# Patient Record
Sex: Male | Born: 1990 | Race: Black or African American | Hispanic: No | Marital: Single | Smoking: Never smoker
Health system: Southern US, Community
[De-identification: ages and names within clinical notes are randomized; demographics above are authoritative.]

---
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
## 2010-10-13 LAB — RAPID STREP SCREEN (MED CTR MEBANE ONLY): Streptococcus, Group A Screen (Direct): NEGATIVE

## 2011-04-27 ENCOUNTER — Ambulatory Visit: Payer: 59

## 2011-04-27 ENCOUNTER — Ambulatory Visit (INDEPENDENT_AMBULATORY_CARE_PROVIDER_SITE_OTHER): Payer: 59 | Admitting: Internal Medicine

## 2011-04-27 VITALS — BP 125/77 | HR 56 | Temp 98.4°F | Resp 16 | Ht 72.0 in | Wt 178.8 lb

## 2011-04-27 DIAGNOSIS — M79609 Pain in unspecified limb: Secondary | ICD-10-CM

## 2011-04-27 DIAGNOSIS — M79604 Pain in right leg: Secondary | ICD-10-CM

## 2011-04-27 DIAGNOSIS — M79669 Pain in unspecified lower leg: Secondary | ICD-10-CM

## 2011-04-27 DIAGNOSIS — IMO0002 Reserved for concepts with insufficient information to code with codable children: Secondary | ICD-10-CM

## 2011-04-27 MED ORDER — CYCLOBENZAPRINE HCL 5 MG PO TABS: 5.0000 mg | ORAL_TABLET | Freq: Three times a day (TID) | ORAL | Status: AC | PRN

## 2011-04-27 MED ORDER — MELOXICAM 7.5 MG PO TABS: 7.5000 mg | ORAL_TABLET | Freq: Every day | ORAL | Status: AC

## 2011-04-27 NOTE — Progress Notes (Signed)
Patient ID: Mason Quinn MRN: [id number], DOB: 03-Sep-1990, 20 y.o. Date of Encounter: 04/27/2011, 1:36 PM  Primary Physician: No primary provider on file.  Chief Complaint: Right leg pain  HPI: 21 y.o. year old male with history below presents with right leg pain for 3 weeks. Pain is located along proximal right knee up to the right inguinal fold. No known injury. Patient states his pain is located only along the anterior aspect of the right leg from the knee to the inguinal. He states he was playing football when he first noticed the pain, as his activity continued his pain gradually continued to worsen. He now states that he cannot take off and run full speed in practice without pain. He is able to walk with minimal difficulty. He states his leg feels tight. He will have sharp pain to palpation. 1 week into his pain he did rest the leg and received considerable improvement, however when he returned to full duty at practice after that his pain returned. He has tried ice, Motrin, and [employer] with some relief. He denies any trauma to the leg. Never with any erythema, ecchymosis, or STS. No pain along the joints of the knee or hip. No back pain. No urinary symptoms. No rash, wounds, or lesions.   No past medical history on file.   Home Meds: Prior to Admission medications   Not on File    Allergies: Not on File  History   Social History  . Marital Status: Single    Spouse Name: N/A    Number of Children: N/A  . Years of Education: N/A   Occupational History  . Not on file.   Social History Main Topics  . Smoking status: Never Smoker   . Smokeless tobacco: Not on file  . Alcohol Use: Not on file  . Drug Use: Not on file  . Sexually Active: Not on file   Other Topics Concern  . Not on file   Social History Narrative  . No narrative on file     Review of Systems: Constitutional: negative for chills, fever, night sweats, weight changes, or fatigue  HEENT: negative for  vision changes, hearing loss, congestion, rhinorrhea, ST, epistaxis, or sinus pressure Cardiovascular: negative for chest pain or palpitations Respiratory: negative for hemoptysis, wheezing, shortness of breath, or cough Abdominal: negative for abdominal pain, nausea, vomiting, diarrhea, or constipation Dermatological: negative for rash Neurologic: negative for headache, dizziness, or syncope All other systems reviewed and are otherwise negative with the exception to those above and in the HPI.   Physical Exam: Blood pressure 125/77, pulse 56, temperature 98.4 F (36.9 C), temperature source Oral, resp. rate 16, height 6' (1.829 m), weight 178 lb 12.8 oz (81.103 kg)., Body mass index is 24.25 kg/(m^2). General: Well developed, well nourished, in no acute distress. Head: Normocephalic, atraumatic, eyes without discharge, sclera non-icteric, nares are without discharge.   Neck: Supple. No thyromegaly. Full ROM. No lymphadenopathy. Lungs: Clear bilaterally to auscultation without wheezes, rales, or rhonchi. Breathing is unlabored. Heart: RRR with S1 S2. No murmurs, rubs, or gallops appreciated. Msk:  Strength and tone normal for age. Extremities/Skin: Right leg with TTP along the  just proximal to the knee along the anterior aspect of the leg only up to the inguinal fold. No TTP medially, laterally, or posteriorly. No boney TTP. FROM through out all joints without pain, 5/5 strength through out all joints. He does exhibit some discomfort along the anterior portion of the leg with resisted  flexion of the knee, but no knee pain. Flexion and extension intact with passive, active, and resisted ROM. Distal pulses 2+. No erythema, STS, or ecchymosis.  Neuro: Alert and oriented X 3. Moves all extremities spontaneously. Gait is normal. CNII-XII grossly in tact. Psych:  Responds to questions appropriately with a normal affect.   [facility] reading (PRIMARY) by  Dr. Merla Riches. Right Hip: Old avulsion fracture,  o/w neg Right Femur: neg Right Knee: neg   ASSESSMENT AND PLAN:  21 y.o. year old male with leg strain -PT -Mobic 7.5 mg 1 po daily prn RF 3 -Flexeril 5 mg #30 1 po tid prn RF 3 -RICE -No football activity until cleared by PT  Signed, Eula Listen, PA-C 04/27/2011 1:36 PM

## 2011-08-04 ENCOUNTER — Telehealth: Payer: Self-pay

## 2011-08-04 NOTE — Telephone Encounter (Signed)
The patient stated he is a patient of Dr. Merla Riches and would like either Dr. Merla Riches or a male provider to return his call regarding some questions that he has.  Please call the patient at [phone].

## 2011-08-04 NOTE — Telephone Encounter (Signed)
Please try and get some information from this patient.

## 2011-08-05 NOTE — Telephone Encounter (Signed)
LMOM to give [place] call back.

## 2011-08-05 NOTE — Telephone Encounter (Signed)
Pt wanted to discuss with Merla Riches about PE, but he will be coming in tomorrow to [place] at [facility]. So he will discuss further then.

## 2011-08-06 ENCOUNTER — Ambulatory Visit (INDEPENDENT_AMBULATORY_CARE_PROVIDER_SITE_OTHER): Payer: 59 | Admitting: Emergency Medicine

## 2011-08-06 VITALS — BP 106/56 | HR 60 | Temp 98.2°F | Resp 16 | Ht 70.0 in | Wt 181.6 lb

## 2011-08-06 DIAGNOSIS — Z0000 Encounter for general adult medical examination without abnormal findings: Secondary | ICD-10-CM

## 2011-08-06 DIAGNOSIS — D239 Other benign neoplasm of skin, unspecified: Secondary | ICD-10-CM

## 2011-08-06 NOTE — Progress Notes (Signed)
  Date:  08/06/2011   Name:  Mason Quinn   DOB:  26-Jul-1990   MRN:  [id number]  PCP:  No primary provider on file.    Chief Complaint: Annual Exam   History of Present Illness:  Mason Quinn is a 21 y.o. very pleasant male patient who presents with the following:  No current complaints.  Is employed as a Retail buyer for [employer].  No meds.  Non smoker.  No surgical history  There is no problem list on file for this patient.   No past medical history on file.  No past surgical history on file.  History  Substance Use Topics  . Smoking status: Never Smoker   . Smokeless tobacco: Not on file  . Alcohol Use: Not on file    No family history on file.  No Known Allergies  Medication list has been reviewed and updated.  Current Outpatient Prescriptions on File Prior to Visit  Medication Sig Dispense Refill  . meloxicam (MOBIC) 7.5 MG tablet Take 1 tablet (7.5 mg total) by mouth daily.  30 tablet  3    Review of Systems:  As per HPI, otherwise negative.    Physical Examination: Filed Vitals:   08/06/11 1259  BP: 106/56  Pulse: 60  Temp: 98.2 F (36.8 C)  Resp: 16   Filed Vitals:   08/06/11 1259  Height: 5\' 10"  (1.778 m)  Weight: 181 lb 9.6 oz (82.373 kg)   Body mass index is 26.06 kg/(m^2). Ideal Body Weight: Weight in (lb) to have BMI = 25: 173.9   GEN: WDWN, NAD, Non-toxic, A & O x 3 HEENT: Atraumatic, Normocephalic. Neck supple. No masses, No LAD. Ears and Nose: No external deformity. CV: RRR, No M/G/R. No JVD. No thrill. No extra heart sounds. PULM: CTA B, no wheezes, crackles, rhonchi. No retractions. No resp. distress. No accessory muscle use. ABD: S, NT, ND, +BS. No rebound. No HSM. EXTR: No c/c/e NEURO Normal gait.  PSYCH: Normally interactive. Conversant. Not depressed or anxious appearing.  Calm demeanor.   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 Path submitted. Carmelina Dane, MD

## 2011-08-06 NOTE — Addendum Note (Signed)
Addended by: Carmelina Dane on: 08/06/2011 02:46 PM   Modules accepted: Orders

## 2011-08-12 ENCOUNTER — Encounter: Payer: Self-pay | Admitting: Emergency Medicine

## 2011-12-13 ENCOUNTER — Ambulatory Visit (INDEPENDENT_AMBULATORY_CARE_PROVIDER_SITE_OTHER): Payer: 59 | Admitting: Emergency Medicine

## 2011-12-13 ENCOUNTER — Other Ambulatory Visit: Payer: Self-pay | Admitting: Emergency Medicine

## 2011-12-13 ENCOUNTER — Ambulatory Visit: Payer: 59

## 2011-12-13 VITALS — BP 146/84 | HR 80 | Temp 97.8°F | Resp 18 | Ht 70.5 in | Wt 183.0 lb

## 2011-12-13 DIAGNOSIS — S86919A Strain of unspecified muscle(s) and tendon(s) at lower leg level, unspecified leg, initial encounter: Secondary | ICD-10-CM

## 2011-12-13 DIAGNOSIS — S86819A Strain of other muscle(s) and tendon(s) at lower leg level, unspecified leg, initial encounter: Secondary | ICD-10-CM

## 2011-12-13 DIAGNOSIS — A630 Anogenital (venereal) warts: Secondary | ICD-10-CM

## 2011-12-13 DIAGNOSIS — S838X9A Sprain of other specified parts of unspecified knee, initial encounter: Secondary | ICD-10-CM

## 2011-12-13 DIAGNOSIS — J329 Chronic sinusitis, unspecified: Secondary | ICD-10-CM

## 2011-12-13 DIAGNOSIS — J40 Bronchitis, not specified as acute or chronic: Secondary | ICD-10-CM

## 2011-12-13 LAB — POCT INFLUENZA A/B: Influenza B, POC: NEGATIVE

## 2011-12-13 MED ORDER — PODOFILOX 0.5 % EX GEL: CUTANEOUS | Status: DC

## 2011-12-13 MED ORDER — AMOXICILLIN 875 MG PO TABS: 875.0000 mg | ORAL_TABLET | Freq: Two times a day (BID) | ORAL | Status: DC

## 2011-12-13 NOTE — Progress Notes (Signed)
  Subjective:    Patient ID: Mason Quinn, male    DOB: 10-Dec-1990, 21 y.o.   MRN: [id number]  HPI patient states that about a week ago he had an episode of head congestion sore throat and cough. This seemed to resolve and then over the weekend he developed symptoms again with head congestion sore throat and a productive cough. He also has had some colored drainage from his nose.    Review of Systems patient plays flag football and had a twisting injury to his right knee approximately 3 weeks ago he felt a pop in his knee at that time and has had pain in his knee off and on ever since. He also would like treatment for multiple warts he has in his genital area. He saw Dr. Dareen Piano in the summer and had biopsy of 2 lesions and they were found to be warts.     Objective:   Physical Exam HEENT exam is unremarkable. His neck is supple. Chest is clear to auscultation and percussion cardiac exam is regular rate without murmurs or gallops. Examination of the right knee reveals tenderness over the medial joint space on the right. There is a positive McMurray's test. Negative drawer sign. He has no effusion noted. Results for orders placed in visit on 12/13/11  POCT INFLUENZA A/B      Component Value Range   Influenza A, POC Negative     Influenza B, POC Negative    POCT RAPID STREP A (OFFICE)      Component Value Range   Rapid Strep A Screen Negative  Negative   [facility] reading (PRIMARY) by  Dr.Andrae Claunch normal right knee. 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     Assessment & Plan:  Patient here with a sinusitis we will treat with amoxicillin twice a day. He was advised he has a medial meniscus injury of his knee and if he continues to have problems he will need to see the orthopedist or have an MRI. He has multiple condylomata across the pubic area and we'll treat this with Condylox .

## 2012-01-10 ENCOUNTER — Ambulatory Visit (INDEPENDENT_AMBULATORY_CARE_PROVIDER_SITE_OTHER): Payer: 59 | Admitting: Emergency Medicine

## 2012-01-10 VITALS — BP 123/74 | HR 48 | Temp 98.6°F | Resp 16 | Ht 70.0 in | Wt 182.0 lb

## 2012-01-10 DIAGNOSIS — M2390 Unspecified internal derangement of unspecified knee: Secondary | ICD-10-CM

## 2012-01-10 MED ORDER — MELOXICAM 15 MG PO TABS: 15.0000 mg | ORAL_TABLET | Freq: Every day | ORAL | Status: DC

## 2012-01-10 NOTE — Progress Notes (Signed)
Reviewed and agree.

## 2012-01-10 NOTE — Patient Instructions (Signed)
Knee Sprain  A knee sprain is a tear in one of the strong, fibrous tissues that connect the bones (ligaments) in your knee. The severity of the sprain depends on how much of the ligament is torn. The tear can be either partial or complete.  CAUSES   Often, sprains are a result of a fall or injury. The force of the impact causes the fibers of your ligament to stretch too much. This excess tension causes the fibers of your ligament to tear.  SYMPTOMS   You may have some loss of motion in your knee. Other symptoms include:   Bruising.   Tenderness.   Swelling.  DIAGNOSIS   In order to diagnose knee sprain, your caregiver will physically examine your knee to determine how torn the ligament is. Your caregiver may also suggest an X-ray exam of your knee to make sure no bones are broken.  TREATMENT   If your ligament is only partially torn, treatment usually involves keeping the knee in a fixed position (immobilization) or bracing your knee for activities that require movement for several weeks. To do this, your caregiver will apply a bandage, cast, or splint to keep your knee from moving or support your knee during movement until it heals. For a partially torn ligament, the healing process usually takes 4 to 6 weeks.  If your ligament is completely torn, depending on which ligament it is, you may need surgery to reconnect the ligament to the bone or reconstruct it. After surgery, a cast or splint may be applied and will need to stay on your knee for 4 to 6 weeks while your ligament heals.  HOME CARE INSTRUCTIONS   Keep your injured knee elevated to decrease swelling.   To ease pain and swelling, apply ice to your knee twice a day, for 2 to 3 days:   Put ice in a plastic bag.   Place a towel between your skin and the bag.   Leave the ice on for 15 minutes.   Only take over-the-counter or prescription medicine for pain as directed by your caregiver.   Do not leave your knee unprotected until pain and stiffness go  away (usually 4 to 6 weeks).   Do not allow your cast or splint to get wet. If you have been instructed not to remove it, cover your cast or splint with a plastic bag when you shower or bathe. Do not swim.   Your caregiver may suggest exercises for you to do during your recovery to prevent or limit permanent weakness and stiffness.  SEEK IMMEDIATE MEDICAL CARE IF:   Your cast or splint becomes damaged.   Your pain becomes worse.  MAKE SURE YOU:   Understand these instructions.   Will watch your condition.   Will get help right away if you are not doing well or get worse.  Document Released: 12/28/2004 Document Revised: 03/22/2011 Document Reviewed: 12/12/2010  ExitCare Patient Information 2013 [employer].

## 2012-01-10 NOTE — Progress Notes (Signed)
[facility] [address], [place] [address] [phone]  Date:  01/10/2012   Name:  Mason Quinn   DOB:  06/01/1990   MRN:  [id number]  PCP:  No primary provider on file.    Chief Complaint: Knee Pain   History of Present Illness:  Mason Quinn is a 21 y.o. very pleasant male patient who presents with the following:  Injured knee five weeks ago when he planted the right knee and was hit playing football.  Had xray 3 weeks ago and was negative.  Still has pain when walks at work.  Sometimes on arising in the morning.  Denies clicking or locking.  No effusion.  There is no problem list on file for this patient.   History reviewed. No pertinent past medical history.  History reviewed. No pertinent past surgical history.  History  Substance Use Topics  . Smoking status: Never Smoker   . Smokeless tobacco: Not on file  . Alcohol Use: No    History reviewed. No pertinent family history.  No Known Allergies  Medication list has been reviewed and updated.  Current Outpatient Prescriptions on File Prior to Visit  Medication Sig Dispense Refill  . amoxicillin (AMOXIL) 875 MG tablet Take 1 tablet (875 mg total) by mouth 2 (two) times daily.  20 tablet  0  . meloxicam (MOBIC) 7.5 MG tablet Take 1 tablet (7.5 mg total) by mouth daily.  30 tablet  3  . podofilox (CONDYLOX) 0.5 % gel Apply twice daily for 3 days then rest 4 days and repeat this cycle 4 times  3.5 g  3    Review of Systems:  As per HPI, otherwise negative.    Physical Examination: Filed Vitals:   01/10/12 1048  BP: 123/74  Pulse: 48  Temp: 98.6 F (37 C)  Resp: 16   Filed Vitals:   01/10/12 1048  Height: 5\' 10"  (1.778 m)  Weight: 182 lb (82.555 kg)   Body mass index is 26.11 kg/(m^2). Ideal Body Weight: Weight in (lb) to have BMI = 25: 173.9    GEN: WDWN, NAD, Non-toxic, Alert & Oriented x 3 HEENT: Atraumatic, Normocephalic.  Ears and Nose: No external  deformity. EXTR: No clubbing/cyanosis/edema.  Right knee tenderness medial knee.  Joint stable, full passive and active range of motion. NEURO: Normal gait.  PSYCH: Normally interactive. Conversant. Not depressed or anxious appearing.  Calm demeanor.    Assessment and Plan: Internal derangement of knee mobic Follow up with Dr Neva Seat for sports medicine  Dareen Piano, Tessa Lerner, MD

## 2013-03-19 ENCOUNTER — Ambulatory Visit (INDEPENDENT_AMBULATORY_CARE_PROVIDER_SITE_OTHER): Payer: 59 | Admitting: Emergency Medicine

## 2013-03-19 ENCOUNTER — Ambulatory Visit: Payer: 59

## 2013-03-19 VITALS — BP 118/78 | HR 63 | Temp 99.1°F | Resp 18 | Ht 71.0 in | Wt 180.6 lb

## 2013-03-19 DIAGNOSIS — S335XXA Sprain of ligaments of lumbar spine, initial encounter: Secondary | ICD-10-CM

## 2013-03-19 DIAGNOSIS — Z0000 Encounter for general adult medical examination without abnormal findings: Secondary | ICD-10-CM

## 2013-03-19 DIAGNOSIS — M79609 Pain in unspecified limb: Secondary | ICD-10-CM

## 2013-03-19 DIAGNOSIS — M549 Dorsalgia, unspecified: Secondary | ICD-10-CM

## 2013-03-19 DIAGNOSIS — M79673 Pain in unspecified foot: Secondary | ICD-10-CM

## 2013-03-19 MED ORDER — POLYETHYLENE GLYCOL 3350 17 GM/SCOOP PO POWD: 17.0000 g | Freq: Once | ORAL | Status: DC

## 2013-03-19 MED ORDER — NAPROXEN SODIUM 550 MG PO TABS: 550.0000 mg | ORAL_TABLET | Freq: Two times a day (BID) | ORAL | Status: DC

## 2013-03-19 NOTE — Patient Instructions (Signed)
Lumbosacral Strain Lumbosacral strain is a strain of any of the parts that make up your lumbosacral vertebrae. Your lumbosacral vertebrae are the bones that make up the lower third of your backbone. Your lumbosacral vertebrae are held together by muscles and tough, fibrous tissue (ligaments).  CAUSES  A sudden blow to your back can cause lumbosacral strain. Also, anything that causes an excessive stretch of the muscles in the low back can cause this strain. This is typically seen when people exert themselves strenuously, fall, lift heavy objects, bend, or crouch repeatedly. RISK FACTORS  Physically demanding work.  Participation in pushing or pulling sports or sports that require sudden twist of the back (tennis, golf, baseball).  Weight lifting.  Excessive lower back curvature.  Forward-tilted pelvis.  Weak back or abdominal muscles or both.  Tight hamstrings. SIGNS AND SYMPTOMS  Lumbosacral strain may cause pain in the area of your injury or pain that moves (radiates) down your leg.  DIAGNOSIS Your health care provider can often diagnose lumbosacral strain through a physical exam. In some cases, you may need tests such as X-ray exams.  TREATMENT  Treatment for your lower back injury depends on many factors that your clinician will have to evaluate. However, most treatment will include the use of anti-inflammatory medicines. HOME CARE INSTRUCTIONS   Avoid hard physical activities (tennis, racquetball, waterskiing) if you are not in proper physical condition for it. This may aggravate or create problems.  If you have a back problem, avoid sports requiring sudden body movements. Swimming and walking are generally safer activities.  Maintain good posture.  Maintain a healthy weight.  For acute conditions, you may put ice on the injured area.  Put ice in a plastic bag.  Place a towel between your skin and the bag.  Leave the ice on for 20 minutes, 2 3 times a day.  When the  low back starts healing, stretching and strengthening exercises may be recommended. SEEK MEDICAL CARE IF:  Your back pain is getting worse.  You experience severe back pain not relieved with medicines. SEEK IMMEDIATE MEDICAL CARE IF:   You have numbness, tingling, weakness, or problems with the use of your arms or legs.  There is a change in bowel or bladder control.  You have increasing pain in any area of the body, including your belly (abdomen).  You notice shortness of breath, dizziness, or feel faint.  You feel sick to your stomach (nauseous), are throwing up (vomiting), or become sweaty.  You notice discoloration of your toes or legs, or your feet get very cold. MAKE SURE YOU:   Understand these instructions.  Will watch your condition.  Will get help right away if you are not doing well or get worse. Document Released: 10/07/2004 Document Revised: 10/18/2012 Document Reviewed: 08/16/2012 ExitCare Patient Information 2014 [employer].  

## 2013-03-19 NOTE — Addendum Note (Signed)
Addended by: Ellison Carwin S on: 03/19/2013 08:00 PM   Modules accepted: Orders

## 2013-03-19 NOTE — Progress Notes (Addendum)
[facility] [address] [place] [address] [phone]  Date:  03/19/2013   Name:  Mason Quinn   DOB:  [birth year]   MRN:  [id number]  PCP:  No PCP Per Patient    Chief Complaint: Annual Exam, Back Pain and Foot Injury   History of Present Illness:  Mason Quinn is a 23 y.o. very pleasant male patient who presents with the following:  For wellness examination.  Non smoker.  Just graduated college.  Working full time.  Has pain in low back following a football game.  No direct history of injury.  Has no radiation of pain.  No neuro symptoms.   Has pain in the lateral plantar foot near base of 5th MT.  No history of injury.  Denies other complaint or health concern today.   There are no active problems to display for this patient.   No past medical history on file.  No past surgical history on file.  History  Substance Use Topics  . Smoking status: Never Smoker   . Smokeless tobacco: Not on file  . Alcohol Use: No    No family history on file.  No Known Allergies  Medication list has been reviewed and updated.  No current outpatient prescriptions on file prior to visit.   No current facility-administered medications on file prior to visit.    Review of Systems:  As per HPI, otherwise negative.    Physical Examination: Filed Vitals:   03/19/13 1842  BP: 118/78  Pulse: 63  Temp: 99.1 F (37.3 C)  Resp: 18   Filed Vitals:   03/19/13 1842  Height: 5\' 11"  (1.803 m)  Weight: 180 lb 9.6 oz (81.92 kg)   Body mass index is 25.2 kg/(m^2). Ideal Body Weight: Weight in (lb) to have BMI = 25: 178.9  GEN: WDWN, NAD, Non-toxic, A & O x 3 HEENT: Atraumatic, Normocephalic. Neck supple. No masses, No LAD. Ears and Nose: No external deformity. CV: RRR, No M/G/R. No JVD. No thrill. No extra heart sounds. PULM: CTA B, no wheezes, crackles, rhonchi. No retractions. No resp. distress. No accessory muscle use. ABD: S, NT, ND, +BS. No  rebound. No HSM. EXTR: No c/c/e NEURO Normal gait.  PSYCH: Normally interactive. Conversant. Not depressed or anxious appearing.  Calm demeanor.    Assessment and Plan: Wellness examination   Signed,  Ellison Carwin, MD   [facility] reading (PRIMARY) by  Dr. Ouida Sills.  Negative foot .  [facility] reading (PRIMARY) by  Dr. Ouida Sills.  Negative LS spine.

## 2013-03-19 NOTE — Addendum Note (Signed)
Addended by: Roselee Culver on: 03/19/2013 07:19 PM   Modules accepted: Orders, Level of Service

## 2013-06-05 ENCOUNTER — Ambulatory Visit: Payer: 59

## 2013-06-05 ENCOUNTER — Ambulatory Visit (INDEPENDENT_AMBULATORY_CARE_PROVIDER_SITE_OTHER): Payer: 59 | Admitting: Emergency Medicine

## 2013-06-05 VITALS — BP 128/78 | HR 54 | Temp 98.8°F | Resp 16 | Ht 69.25 in | Wt 178.0 lb

## 2013-06-05 DIAGNOSIS — S43006A Unspecified dislocation of unspecified shoulder joint, initial encounter: Secondary | ICD-10-CM

## 2013-06-05 DIAGNOSIS — Z299 Encounter for prophylactic measures, unspecified: Secondary | ICD-10-CM

## 2013-06-05 DIAGNOSIS — R300 Dysuria: Secondary | ICD-10-CM

## 2013-06-05 DIAGNOSIS — Z113 Encounter for screening for infections with a predominantly sexual mode of transmission: Secondary | ICD-10-CM

## 2013-06-05 DIAGNOSIS — Z298 Encounter for other specified prophylactic measures: Secondary | ICD-10-CM

## 2013-06-05 LAB — POCT URINALYSIS DIPSTICK
BILIRUBIN UA: NEGATIVE
Glucose, UA: NEGATIVE
Ketones, UA: NEGATIVE
NITRITE UA: NEGATIVE
PH UA: 7
PROTEIN UA: NEGATIVE
RBC UA: NEGATIVE
Spec Grav, UA: 1.015
Urobilinogen, UA: 0.2

## 2013-06-05 LAB — POCT UA - MICROSCOPIC ONLY
Bacteria, U Microscopic: NEGATIVE
CASTS, UR, LPF, POC: NEGATIVE
Crystals, Ur, HPF, POC: NEGATIVE
EPITHELIAL CELLS, URINE PER MICROSCOPY: NEGATIVE
Mucus, UA: NEGATIVE
RBC, urine, microscopic: NEGATIVE
WBC, Ur, HPF, POC: NEGATIVE
Yeast, UA: NEGATIVE

## 2013-06-05 MED ORDER — AZITHROMYCIN 250 MG PO TABS: ORAL_TABLET | ORAL | Status: DC

## 2013-06-05 MED ORDER — CEFTRIAXONE SODIUM 1 G IJ SOLR
250.0000 mg | Freq: Once | INTRAMUSCULAR | Status: AC
  Administered 2013-06-05: 250 mg via INTRAMUSCULAR

## 2013-06-05 NOTE — Progress Notes (Addendum)
   Subjective:    Patient ID: Mason Quinn, male    DOB: 23-Aug-1990, 23 y.o.   MRN: [id number]  HPI 23 yo male with 2 complaints:  1.  History of right shoulder dislocation twice in past who had it come out twice this Sunday.  States he was boxing and it came out once, then fell jetskiing and it came out again.  Self reduced both times.  Limited ROM since then.  Continued pain.  2.  Also complaints of dysuria for last 12 hours.  States he has been using protection with sexually activity (condoms).  States increased frequency with some discharge and pain.  No abdominal pain.  PPMH:  Noncontributory  SH:  Non smoker, no alcohol   Review of Systems  Constitutional: Negative for fever and chills.  Gastrointestinal: Negative for nausea, vomiting, diarrhea and constipation.  Genitourinary: Positive for dysuria, urgency, discharge and difficulty urinating. Negative for frequency, penile swelling, genital sores, penile pain and testicular pain.  Musculoskeletal: Positive for arthralgias. Negative for back pain, joint swelling and neck pain.       Objective:   Physical Exam Blood pressure 128/78, pulse 54, temperature 98.8 F (37.1 C), temperature source Oral, resp. rate 16, height 5' 9.25" (1.759 m), weight 178 lb (80.74 kg), SpO2 100.00%. Body mass index is 26.09 kg/(m^2). Well-developed, well nourished male who is awake, alert and oriented, in NAD. HEENT: [place]/AT, PERRL, EOMI.  Sclera and conjunctiva are clear. OP is clear. Neck: supple, non-tender, no lymphadenopathy, thyromegaly. Heart: RRR, no murmur Lungs: normal effort, CTA Abdomen: normo-active bowel sounds, supple, non-tender, no mass or organomegaly. Extremities: right shoulder with limited ROM in all 4 directions.  Positive apprehension.  N/V intact. Skin: warm and dry without rash. Psychologic: good mood and appropriate affect, normal speech and behavior.  Results for orders placed in visit on 06/05/13  POCT URINALYSIS  DIPSTICK      Result Value Ref Range   Color, UA yellow     Clarity, UA clear     Glucose, UA neg     Bilirubin, UA neg     Ketones, UA neg     Spec Grav, UA 1.015     Blood, UA neg     pH, UA 7.0     Protein, UA neg     Urobilinogen, UA 0.2     Nitrite, UA neg     Leukocytes, UA Trace    POCT UA - MICROSCOPIC ONLY      Result Value Ref Range   WBC, Ur, HPF, POC neg     RBC, urine, microscopic neg     Bacteria, U Microscopic neg     Mucus, UA neg     Epithelial cells, urine per micros neg     Crystals, Ur, HPF, POC neg     Casts, Ur, LPF, POC neg     Yeast, UA neg     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 Dysuria - will treat for GC/Chlamydia HIV test sent per patient request   I have reviewed and agree with documentation. Robert P. Laney Pastor, M.D.

## 2013-06-05 NOTE — Patient Instructions (Addendum)
Shoulder Dislocation Your shoulder is made up of three bones: the collar bone (clavicle); the shoulder blade (scapula), which includes the socket (glenoid cavity); and the upper arm bone (humerus). Your shoulder joint is the place where these bones meet. Strong, fibrous tissues hold these bones together (ligaments). Muscles and strong, fibrous tissues that connect the muscles to these bones (tendons) allow your arm to move through this joint. The range of motion of your shoulder joint is more extensive than most of your other joints, and the glenoid cavity is very shallow. That is the reason that your shoulder joint is one of the most unstable joints in your body. It is far more prone to dislocation than your other joints. Shoulder dislocation is when your humerus is forced out of your shoulder joint. CAUSES Shoulder dislocation is caused by a forceful impact on your shoulder. This impact usually is from an injury, such as a sports injury or a fall. SYMPTOMS Symptoms of shoulder dislocation include:  Deformity of your shoulder.  Intense pain.  Inability to move your shoulder joint.  Numbness, weakness, or tingling around your shoulder joint (your neck or down your arm).  Bruising or swelling around your shoulder. DIAGNOSIS In order to diagnose a dislocated shoulder, your caregiver will perform a physical exam. Your caregiver also may have an X-ray exam done to see if you have any broken bones. Magnetic resonance imaging (MRI) is a procedure that sometimes is done to help your caregiver see any damage to the soft tissues around your shoulder, particularly your rotator cuff tendons. Additionally, your caregiver also may have electromyography done to measure the electrical discharges produced in your muscles if you have signs or symptoms of nerve damage. TREATMENT A shoulder dislocation is treated by placing the humerus back in the joint (reduction). Your caregiver does this either manually (closed  reduction), by moving your humerus back into the joint through manipulation, or through surgery (open reduction). When your humerus is back in place, severe pain should improve almost immediately. You also may need to have surgery if you have a weak shoulder joint or ligaments, and you have recurring shoulder dislocations, despite rehabilitation. In rare cases, surgery is necessary if your nerves or blood vessels are damaged during the dislocation. After your reduction, your arm will be placed in a shoulder immobilizer or sling to keep it from moving. Your caregiver will have you wear your shoulder immoblizer or sling for 3 days to 3 weeks, depending on how serious your dislocation is. When your shoulder immobilizer or sling is removed, your caregiver may prescribe physical therapy to help improve the range of motion in your shoulder joint. HOME CARE INSTRUCTIONS  The following measures can help to reduce pain and speed up the healing process:  Rest your injured joint. Do not move it. Avoid activities similar to the one that caused your injury.  Apply ice to your injured joint for the first day or two after your reduction or as directed by your caregiver. Applying ice helps to reduce inflammation and pain.  Put ice in a plastic bag.  Place a towel between your skin and the bag.  Leave the ice on for 15-20 minutes at a time, every 2 hours while you are awake.  Exercise your hand by squeezing a soft ball. This helps to eliminate stiffness and swelling in your hand and wrist.  Take over-the-counter or prescription medicine for pain or discomfort as told by your caregiver. SEEK IMMEDIATE MEDICAL CARE IF:   Your  shoulder immobilizer or sling becomes damaged.  Your pain becomes worse rather than better.  You lose feeling in your arm or hand, or they become white and cold. MAKE SURE YOU:   Understand these instructions.  Will watch your condition.  Will get help right away if you are not  doing well or get worse. Document Released: 09/22/2000 Document Revised: 03/22/2011 Document Reviewed: 10/18/2010 [facility] Patient Information 2014 [place], [place].   YOU SHOULD GO TO [facility] TOMORROW MORNING, Wednesday MAY 27TH FOR FOLLOW UP.  I WILL MAKE AN APPOINTMENT FOR YOU WITH DR. KRAMER AT 8:30 IN THE MORNING.  THE PHONE NUMBER IS [phone] IF YOU HAVE ANY ISSUES MAKING THE APPOINTMENT PLEASE CALL.  THE ADDRESS IS [place]

## 2013-06-06 ENCOUNTER — Telehealth: Payer: Self-pay

## 2013-06-06 LAB — HIV ANTIBODY (ROUTINE TESTING W REFLEX): HIV 1&2 Ab, 4th Generation: NONREACTIVE

## 2013-06-07 ENCOUNTER — Telehealth: Payer: Self-pay | Admitting: Emergency Medicine

## 2013-06-07 LAB — URINE CULTURE
Colony Count: NO GROWTH
ORGANISM ID, BACTERIA: NO GROWTH

## 2013-06-07 LAB — GC/CHLAMYDIA PROBE AMP
CT PROBE, AMP APTIMA: POSITIVE — AB
GC PROBE AMP APTIMA: POSITIVE — AB

## 2013-06-07 NOTE — Telephone Encounter (Signed)
Notified him of HIV and CG/CT test results.

## 2013-07-14 ENCOUNTER — Ambulatory Visit (INDEPENDENT_AMBULATORY_CARE_PROVIDER_SITE_OTHER): Payer: 59 | Admitting: Emergency Medicine

## 2013-07-14 ENCOUNTER — Ambulatory Visit (INDEPENDENT_AMBULATORY_CARE_PROVIDER_SITE_OTHER): Payer: 59

## 2013-07-14 VITALS — BP 116/60 | HR 69 | Temp 98.3°F | Resp 16 | Ht 70.0 in | Wt 177.0 lb

## 2013-07-14 DIAGNOSIS — T148XXA Other injury of unspecified body region, initial encounter: Secondary | ICD-10-CM

## 2013-07-14 DIAGNOSIS — Z9109 Other allergy status, other than to drugs and biological substances: Secondary | ICD-10-CM

## 2013-07-14 DIAGNOSIS — J029 Acute pharyngitis, unspecified: Secondary | ICD-10-CM

## 2013-07-14 DIAGNOSIS — S63259A Unspecified dislocation of unspecified finger, initial encounter: Secondary | ICD-10-CM

## 2013-07-14 LAB — POCT RAPID STREP A (OFFICE): RAPID STREP A SCREEN: NEGATIVE

## 2013-07-14 MED ORDER — AMOXICILLIN-POT CLAVULANATE 875-125 MG PO TABS: 1.0000 | ORAL_TABLET | Freq: Two times a day (BID) | ORAL | Status: DC

## 2013-07-14 MED ORDER — FLUTICASONE PROPIONATE 50 MCG/ACT NA SUSP: 2.0000 | Freq: Every day | NASAL | Status: DC

## 2013-07-14 NOTE — Progress Notes (Signed)
Subjective:  This chart was scribed for Remo Lipps A. Gerardine Peltz MD,    by Stacy Gardner, [facility] Scribe. The patient was seen in room and the patient's care was started at 11:26 AM.  Chief Complaint  Patient presents with  . Hand Pain    x 1 week L ring finger  . Facial Pain    x 1 day  . Headache     Patient ID: Mason Quinn, male    DOB: 1990-04-02, 23 y.o.   MRN: [id number]  07/14/2013  Hand Pain, Facial Pain and Headache   Hand Pain   Headache  Associated symptoms include coughing, rhinorrhea, sinus pressure and a sore throat. Pertinent negatives include no fever.   HPI Comments: Mason Quinn is a 23 y.o. male who arrives to the [facility] complaining of sore throat last night. He had a mild productive cough with yellow sputum. Denies fever. He has chills, headaches, congestion, rhinorrhea and sinus pressure as associated symptoms. He tried taking Theraflu and he had a dose today. Pt has a hx of year round allergies and does not take any medications for it. He is unsure of sick contact. Nothing seems to help.  He has trouble hearing and would like to have cerumen removed today during this visit.   He also complains of left fourth finger pain with swelling after dislocating it while playing football last week. Pt reports that he reduced it back into place but it popped out afterwards.  He is unable to make a fist or extend it. Pt fell onto his hand after the injury.   There are no active problems to display for this patient.  History reviewed. No pertinent past medical history. History reviewed. No pertinent past surgical history. No Known Allergies Prior to Admission medications   Medication Sig Start Date End Date Taking? Authorizing Provider  azithromycin (ZITHROMAX) 250 MG tablet Take 4 tabs PO x 1 06/05/13   Hennie Duos, MD  naproxen sodium (ANAPROX DS) 550 MG tablet Take 1 tablet (550 mg total) by mouth 2 (two) times  daily with a meal. 03/19/13 03/19/14  Ellison Carwin, MD  polyethylene glycol powder (GLYCOLAX/MIRALAX) powder Take 17 g by mouth once. 03/19/13   Ellison Carwin, MD   History   Social History  . Marital Status: Single    Spouse Name: N/A    Number of Children: N/A  . Years of Education: N/A   Occupational History  . Not on file.   Social History Main Topics  . Smoking status: Never Smoker   . Smokeless tobacco: Not on file  . Alcohol Use: No  . Drug Use: No  . Sexual Activity: Yes   Other Topics Concern  . Not on file   Social History Narrative  . No narrative on file     Review of Systems  Constitutional: Positive for chills. Negative for fever.  HENT: Positive for congestion, rhinorrhea, sinus pressure and sore throat.   Respiratory: Positive for cough.   Musculoskeletal: Positive for arthralgias, joint swelling and myalgias.  Allergic/Immunologic: Positive for environmental allergies.  Neurological: Positive for headaches.    History reviewed. No pertinent past medical history. No Known Allergies Current Outpatient Prescriptions  Medication Sig Dispense Refill  . azithromycin (ZITHROMAX) 250 MG tablet Take 4 tabs PO x 1  4 tablet  0  . naproxen sodium (ANAPROX DS) 550 MG tablet Take 1 tablet (550 mg total) by mouth 2 (two)  times daily with a meal.  60 tablet  1  . polyethylene glycol powder (GLYCOLAX/MIRALAX) powder Take 17 g by mouth once.  3350 g  1   No current facility-administered medications for this visit.       Objective:     Filed Vitals:   07/14/13 1122  BP: 116/60  Pulse: 69  Temp: 98.3 F (36.8 C)  TempSrc: Oral  Resp: 16  Height: 5\' 10"  (1.778 m)  Weight: 177 lb (80.287 kg)  SpO2: 99%    DIAGNOSTIC STUDIES: Oxygen Saturation is 99% on room air, normla by my interpretation.    COORDINATION OF CARE:  11:26 AM Discussed course of care with pt . Pt understands and agrees.    Physical Exam  Nursing note and vitals  reviewed. Constitutional: He is oriented to person, place, and time. He appears well-developed and well-nourished. No distress.  HENT:  Head: Atraumatic.  Nose: Rhinorrhea present.  Mouth/Throat: Posterior oropharyngeal erythema present.  Bilateral ears occluded with wax.  Eyes: Conjunctivae and EOM are normal.  Neck: Neck supple. No tracheal deviation present.  Cardiovascular: Normal rate.   Pulmonary/Chest: Effort normal. No respiratory distress. He has no wheezes. He has no rales.  Musculoskeletal: Normal range of motion.  Neurological: He is alert and oriented to person, place, and time.  Skin: Skin is warm and dry.  Psychiatric: He has a normal mood and affect. His behavior is normal.  Extremities there is significant swelling of the PIP joint of the left fourth finger. There appears to be some instability of the radial collateral ligament at this joint  [facility] reading (PRIMARY) by  Dr.Ayrton Mcvay there is a tiny 1 x 2 mm fragment at the PIP joint no other obvious fractures      Assessment & Plan:  Patient has a tiny chip fracture of his finger but it orthopedics to help with this. We'll treat his allergies and sinus infection with Augmentin and Flonase. He is already on Claritin-D .

## 2013-07-14 NOTE — Patient Instructions (Signed)
Sinusitis Sinusitis is redness, soreness, and swelling (inflammation) of the paranasal sinuses. Paranasal sinuses are air pockets within the bones of your face (beneath the eyes, the middle of the forehead, or above the eyes). In healthy paranasal sinuses, mucus is able to drain out, and air is able to circulate through them by way of your nose. However, when your paranasal sinuses are inflamed, mucus and air can become trapped. This can allow bacteria and other germs to grow and cause infection. Sinusitis can develop quickly and last only a short time (acute) or continue over a long period (chronic). Sinusitis that lasts for more than 12 weeks is considered chronic.  CAUSES  Causes of sinusitis include:  Allergies.  Structural abnormalities, such as displacement of the cartilage that separates your nostrils (deviated septum), which can decrease the air flow through your nose and sinuses and affect sinus drainage.  Functional abnormalities, such as when the small hairs (cilia) that line your sinuses and help remove mucus do not work properly or are not present. SYMPTOMS  Symptoms of acute and chronic sinusitis are the same. The primary symptoms are pain and pressure around the affected sinuses. Other symptoms include:  Upper toothache.  Earache.  Headache.  Bad breath.  Decreased sense of smell and taste.  A cough, which worsens when you are lying flat.  Fatigue.  Fever.  Thick drainage from your nose, which often is green and may contain pus (purulent).  Swelling and warmth over the affected sinuses. DIAGNOSIS  Your caregiver will perform a physical exam. During the exam, your caregiver may:  Look in your nose for signs of abnormal growths in your nostrils (nasal polyps).  Tap over the affected sinus to check for signs of infection.  View the inside of your sinuses (endoscopy) with a special imaging device with a light attached (endoscope), which is inserted into your  sinuses. If your caregiver suspects that you have chronic sinusitis, one or more of the following tests may be recommended:  Allergy tests.  Nasal culture--A sample of mucus is taken from your nose and sent to a lab and screened for bacteria.  Nasal cytology--A sample of mucus is taken from your nose and examined by your caregiver to determine if your sinusitis is related to an allergy. TREATMENT  Most cases of acute sinusitis are related to a viral infection and will resolve on their own within 10 days. Sometimes medicines are prescribed to help relieve symptoms (pain medicine, decongestants, nasal steroid sprays, or saline sprays).  However, for sinusitis related to a bacterial infection, your caregiver will prescribe antibiotic medicines. These are medicines that will help kill the bacteria causing the infection.  Rarely, sinusitis is caused by a fungal infection. In theses cases, your caregiver will prescribe antifungal medicine. For some cases of chronic sinusitis, surgery is needed. Generally, these are cases in which sinusitis recurs more than 3 times per year, despite other treatments. HOME CARE INSTRUCTIONS   Drink plenty of water. Water helps thin the mucus so your sinuses can drain more easily.  Use a humidifier.  Inhale steam 3 to 4 times a day (for example, sit in the bathroom with the shower running).  Apply a warm, moist washcloth to your face 3 to 4 times a day, or as directed by your caregiver.  Use saline nasal sprays to help moisten and clean your sinuses.  Take over-the-counter or prescription medicines for pain, discomfort, or fever only as directed by your caregiver. SEEK IMMEDIATE MEDICAL CARE IF:  You have increasing pain or severe headaches.  You have nausea, vomiting, or drowsiness.  You have swelling around your face.  You have vision problems.  You have a stiff neck.  You have difficulty breathing. MAKE SURE YOU:   Understand these  instructions.  Will watch your condition.  Will get help right away if you are not doing well or get worse. Document Released: 12/28/2004 Document Revised: 03/22/2011 Document Reviewed: 01/12/2011 [facility] Patient Information 2015 [place], [place]. This information is not intended to replace advice given to you by your health care provider. Make sure you discuss any questions you have with your health care provider. Allergies Allergies may happen from anything your body is sensitive to. This may be food, medicines, pollens, chemicals, and nearly anything around you in everyday life that produces allergens. An allergen is anything that causes an allergy producing substance. Heredity is often a factor in causing these problems. This means you may have some of the same allergies as your parents. Food allergies happen in all age groups. Food allergies are some of the most severe and life threatening. Some common food allergies are cow's milk, seafood, eggs, nuts, wheat, and soybeans. SYMPTOMS   Swelling around the mouth.  An itchy red rash or hives.  Vomiting or diarrhea.  Difficulty breathing. SEVERE ALLERGIC REACTIONS ARE LIFE-THREATENING. This reaction is called anaphylaxis. It can cause the mouth and throat to swell and cause difficulty with breathing and swallowing. In severe reactions only a trace amount of food (for example, peanut oil in a salad) may cause death within seconds. Seasonal allergies occur in all age groups. These are seasonal because they usually occur during the same season every year. They may be a reaction to molds, grass pollens, or tree pollens. Other causes of problems are house dust mite allergens, pet dander, and mold spores. The symptoms often consist of nasal congestion, a runny itchy nose associated with sneezing, and tearing itchy eyes. There is often an associated itching of the mouth and ears. The problems happen when you come in contact with pollens and other  allergens. Allergens are the particles in the air that the body reacts to with an allergic reaction. This causes you to release allergic antibodies. Through a chain of events, these eventually cause you to release histamine into the blood stream. Although it is meant to be protective to the body, it is this release that causes your discomfort. This is why you were given anti-histamines to feel better. If you are unable to pinpoint the offending allergen, it may be determined by skin or blood testing. Allergies cannot be cured but can be controlled with medicine. Hay fever is a collection of all or some of the seasonal allergy problems. It may often be treated with simple over-the-counter medicine such as diphenhydramine. Take medicine as directed. Do not drink alcohol or drive while taking this medicine. Check with your caregiver or package insert for child dosages. If these medicines are not effective, there are many new medicines your caregiver can prescribe. Stronger medicine such as nasal spray, eye drops, and corticosteroids may be used if the first things you try do not work well. Other treatments such as immunotherapy or desensitizing injections can be used if all else fails. Follow up with your caregiver if problems continue. These seasonal allergies are usually not life threatening. They are generally more of a nuisance that can often be handled using medicine. HOME CARE INSTRUCTIONS   If unsure what causes a reaction, keep a  diary of foods eaten and symptoms that follow. Avoid foods that cause reactions.  If hives or rash are present:  Take medicine as directed.  You may use an over-the-counter antihistamine (diphenhydramine) for hives and itching as needed.  Apply cold compresses (cloths) to the skin or take baths in cool water. Avoid hot baths or showers. Heat will make a rash and itching worse.  If you are severely allergic:  Following a treatment for a severe reaction, hospitalization  is often required for closer follow-up.  Wear a medic-alert bracelet or necklace stating the allergy.  You and your family must learn how to give adrenaline or use an anaphylaxis kit.  If you have had a severe reaction, always carry your anaphylaxis kit or EpiPen with you. Use this medicine as directed by your caregiver if a severe reaction is occurring. Failure to do so could have a fatal outcome. SEEK MEDICAL CARE IF:  You suspect a food allergy. Symptoms generally happen within 30 minutes of eating a food.  Your symptoms have not gone away within 2 days or are getting worse.  You develop new symptoms.  You want to retest yourself or your child with a food or drink you think causes an allergic reaction. Never do this if an anaphylactic reaction to that food or drink has happened before. Only do this under the care of a caregiver. SEEK IMMEDIATE MEDICAL CARE IF:   You have difficulty breathing, are wheezing, or have a tight feeling in your chest or throat.  You have a swollen mouth, or you have hives, swelling, or itching all over your body.  You have had a severe reaction that has responded to your anaphylaxis kit or an EpiPen. These reactions may return when the medicine has worn off. These reactions should be considered life threatening. MAKE SURE YOU:   Understand these instructions.  Will watch your condition.  Will get help right away if you are not doing well or get worse. Document Released: 03/23/2002 Document Revised: 04/24/2012 Document Reviewed: 08/28/2007 [facility] Patient Information 2015 [place], [place]. This information is not intended to replace advice given to you by your health care provider. Make sure you discuss any questions you have with your health care provider.

## 2013-07-16 LAB — CULTURE, GROUP A STREP: ORGANISM ID, BACTERIA: NORMAL

## 2013-07-19 NOTE — Telephone Encounter (Signed)
No msg °

## 2014-03-07 ENCOUNTER — Ambulatory Visit (INDEPENDENT_AMBULATORY_CARE_PROVIDER_SITE_OTHER): Payer: 59 | Admitting: Sports Medicine

## 2014-03-07 VITALS — BP 114/72 | HR 52 | Temp 97.9°F | Resp 16 | Ht 69.5 in | Wt 185.5 lb

## 2014-03-07 DIAGNOSIS — S43004S Unspecified dislocation of right shoulder joint, sequela: Secondary | ICD-10-CM

## 2014-03-07 DIAGNOSIS — M25311 Other instability, right shoulder: Secondary | ICD-10-CM

## 2014-03-07 MED ORDER — SHOULDER BRACE MEDIUM MISC: Status: DC

## 2014-03-07 MED ORDER — ONDANSETRON HCL 4 MG PO TABS: 4.0000 mg | ORAL_TABLET | Freq: Three times a day (TID) | ORAL | Status: DC | PRN

## 2014-03-07 MED ORDER — SHOULDER BRACE LARGE MISC: Status: DC

## 2014-03-07 NOTE — Progress Notes (Signed)
  Mason Quinn - 24 y.o. male MRN [id number]  Date of birth: Aug 18, 1990  SUBJECTIVE: CC:  Chief Complaint  Patient presents with  . Shoulder Pain    Right    HPI: Multiple prior dislocations. 8+; last in november  Boxing, having symptomatic subluxation; using Sully brace in past with marked improvement.  Needs Rx for new brace  No numbness/tingling/weakness  Left arm dominant  Previously evaluated at [facility]  ROS: per HPI  HISTORY:  Past Medical, Surgical, Social, and Family History reviewed & updated per EMR.  Pertinent Historical Findings include:  reports that he has never smoked. He has never used smokeless tobacco. Otherwise healthy No prior surgeries Prior left PIP dislocation of 4th left hand  OBJECTIVE:  VS:   HT:5' 9.5" (176.5 cm)   WT:185 lb 8 oz (84.142 kg)  BMI:27.1          BP:114/72 mmHg  HR:(!) 52bpm  TEMP:97.9 F (36.6 C)(Oral)  RESP:97 %  PHYSICAL EXAM:  Physical Exam  Constitutional: He is well-developed, well-nourished, and in no distress. No distress.  HENT:  Head: Normocephalic and atraumatic.  Eyes: Right eye exhibits no discharge. Left eye exhibits no discharge. No scleral icterus.  Pulmonary/Chest: Effort normal. No respiratory distress.  Skin: He is not diaphoretic.  Psychiatric: Mood, memory, affect and judgment normal.  Right Shoulder Exam   Tenderness  None  Range of Motion  Active Abduction:                       Normal Extension:                                  Abnormal External Rotation:                      80  Muscle Strength  Abduction:            5/5 Internal Rotation:  5/5 External Rotation: 5/5 Supraspinatus:     5/5 Subscapularis:     5/5 Biceps:                 5/5  Tests  Impingement:   Negative Hawkins:          Negative Cross Arm:      Negative Drop Arm:        Negative Apprehension: Positive Sulcus:            Negative  Comments:  + O'Briens    ASSESSMENT: 1. Shoulder dislocation,  right, sequela   2. Shoulder instability, right     PLAN: See problem based charting & AVS for additional documentation.  Rx Sully  Follow up with surgery PRN > Return if symptoms worsen or fail to improve.

## 2014-03-07 NOTE — Progress Notes (Signed)
I have reviewed his documentation and agree with plan of care by Dr. Paulla Fore

## 2014-03-07 NOTE — Patient Instructions (Signed)
Rx provided Follow up with Surgery to discuss surgical correction  Shoulder Dislocation Your shoulder is made up of three bones: the collar bone (clavicle); the shoulder blade (scapula), which includes the socket (glenoid cavity); and the upper arm bone (humerus). Your shoulder joint is the place where these bones meet. Strong, fibrous tissues hold these bones together (ligaments). Muscles and strong, fibrous tissues that connect the muscles to these bones (tendons) allow your arm to move through this joint. The range of motion of your shoulder joint is more extensive than most of your other joints, and the glenoid cavity is very shallow. That is the reason that your shoulder joint is one of the most unstable joints in your body. It is far more prone to dislocation than your other joints. Shoulder dislocation is when your humerus is forced out of your shoulder joint. CAUSES Shoulder dislocation is caused by a forceful impact on your shoulder. This impact usually is from an injury, such as a sports injury or a fall. SYMPTOMS Symptoms of shoulder dislocation include:  Deformity of your shoulder.  Intense pain.  Inability to move your shoulder joint.  Numbness, weakness, or tingling around your shoulder joint (your neck or down your arm).  Bruising or swelling around your shoulder. DIAGNOSIS In order to diagnose a dislocated shoulder, your caregiver will perform a physical exam. Your caregiver also may have an X-ray exam done to see if you have any broken bones. Magnetic resonance imaging (MRI) is a procedure that sometimes is done to help your caregiver see any damage to the soft tissues around your shoulder, particularly your rotator cuff tendons. Additionally, your caregiver also may have electromyography done to measure the electrical discharges produced in your muscles if you have signs or symptoms of nerve damage. TREATMENT A shoulder dislocation is treated by placing the humerus back in the  joint (reduction). Your caregiver does this either manually (closed reduction), by moving your humerus back into the joint through manipulation, or through surgery (open reduction). When your humerus is back in place, severe pain should improve almost immediately. You also may need to have surgery if you have a weak shoulder joint or ligaments, and you have recurring shoulder dislocations, despite rehabilitation. In rare cases, surgery is necessary if your nerves or blood vessels are damaged during the dislocation. After your reduction, your arm will be placed in a shoulder immobilizer or sling to keep it from moving. Your caregiver will have you wear your shoulder immobilizer or sling for 3 days to 3 weeks, depending on how serious your dislocation is. When your shoulder immobilizer or sling is removed, your caregiver may prescribe physical therapy to help improve the range of motion in your shoulder joint. HOME CARE INSTRUCTIONS  The following measures can help to reduce pain and speed up the healing process:  Rest your injured joint. Do not move it. Avoid activities similar to the one that caused your injury.  Apply ice to your injured joint for the first day or two after your reduction or as directed by your caregiver. Applying ice helps to reduce inflammation and pain.  Put ice in a plastic bag.  Place a towel between your skin and the bag.  Leave the ice on for 15-20 minutes at a time, every 2 hours while you are awake.  Exercise your hand by squeezing a soft ball. This helps to eliminate stiffness and swelling in your hand and wrist.  Take over-the-counter or prescription medicine for pain or discomfort as told  by your caregiver. SEEK IMMEDIATE MEDICAL CARE IF:   Your shoulder immobilizer or sling becomes damaged.  Your pain becomes worse rather than better.  You lose feeling in your arm or hand, or they become white and cold. MAKE SURE YOU:   Understand these instructions.  Will  watch your condition.  Will get help right away if you are not doing well or get worse. Document Released: 09/22/2000 Document Revised: 05/14/2013 Document Reviewed: 10/18/2010 [facility] Patient Information 2015 [place], [place]. This information is not intended to replace advice given to you by your health care provider. Make sure you discuss any questions you have with your health care provider.

## 2014-03-08 ENCOUNTER — Telehealth: Payer: Self-pay

## 2014-03-08 NOTE — Telephone Encounter (Signed)
Pt's mom called concerning Elastic Bandages & Supports (SHOULDER BRACE MEDIUM) MISC [[phone]]. I told her earlier she could get it at [facility]. She said it was to expensive there. She would like to know what to do. Please advise at [phone]

## 2014-03-08 NOTE — Telephone Encounter (Signed)
Spoke with Pamala Hurry and this was being handled. Barnett Applebaum spoke to [place] and advised pt's mom to go to [facility] to get fitted for a shoulder brace. Mom states she did not want to go get him sized because the Rx we wrote says size medium. [facility]'s policy is to get fitted by their fit team. Pt hung up on Gina.

## 2014-04-13 ENCOUNTER — Ambulatory Visit (INDEPENDENT_AMBULATORY_CARE_PROVIDER_SITE_OTHER): Payer: 59 | Admitting: Physician Assistant

## 2014-04-13 ENCOUNTER — Other Ambulatory Visit: Payer: Self-pay | Admitting: Physician Assistant

## 2014-04-13 VITALS — BP 130/80 | HR 74 | Temp 98.1°F | Resp 16 | Ht 71.5 in | Wt 167.6 lb

## 2014-04-13 DIAGNOSIS — R369 Urethral discharge, unspecified: Secondary | ICD-10-CM

## 2014-04-13 DIAGNOSIS — Z113 Encounter for screening for infections with a predominantly sexual mode of transmission: Secondary | ICD-10-CM | POA: Diagnosis not present

## 2014-04-13 LAB — POCT UA - MICROSCOPIC ONLY
CASTS, UR, LPF, POC: NEGATIVE
Crystals, Ur, HPF, POC: NEGATIVE
YEAST UA: NEGATIVE

## 2014-04-13 LAB — POCT URINALYSIS DIPSTICK
BILIRUBIN UA: NEGATIVE
Glucose, UA: NEGATIVE
Ketones, UA: NEGATIVE
Nitrite, UA: NEGATIVE
Protein, UA: NEGATIVE
Spec Grav, UA: 1.02
Urobilinogen, UA: 0.2
pH, UA: 5

## 2014-04-13 MED ORDER — AZITHROMYCIN 250 MG PO TABS: 1000.0000 mg | ORAL_TABLET | Freq: Once | ORAL | Status: DC

## 2014-04-13 MED ORDER — CEFTRIAXONE SODIUM 1 G IJ SOLR
250.0000 mg | Freq: Once | INTRAMUSCULAR | Status: AC
  Administered 2014-04-13: 250 mg via INTRAMUSCULAR

## 2014-04-13 NOTE — Progress Notes (Signed)
Patient ID: Mason Quinn, male    DOB: [birth year], 24 y.o.   MRN: [id number]  PCP: No PCP Per Patient  Subjective:   Chief Complaint  Patient presents with  . Exposure to STD    sxs x tuesday  . Penile Discharge    green at first then changed to a white/cream color    HPI  4-5 days of penile discharge and discomfort. Intermittent burning with urination. No urinary urgency or frequency, but has seen some blood. No fever/chills. No nausea/vomiting or diarrhea. No lymphadenopathy. No unexplained weight loss.  Currently has 3 sexual partners. 3 in the past 30 days. Estimates 20-30 partners in his lifetime. Inconsistent condom use. Has contacted his current partners regarding his current symptoms to encourage they get evaluated as well.  Review of Systems Review of Systems     There are no active problems to display for this patient.    Prior to Admission medications   Not on File     No Known Allergies     Objective:  Physical Exam  Physical Exam  Constitutional: He is oriented to person, place, and time. He appears well-developed and well-nourished. He is active and cooperative. No distress.  BP 130/80 mmHg  Pulse 74  Temp(Src) 98.1 F (36.7 C) (Oral)  Resp 16  Ht 5' 11.5" (1.816 m)  Wt 167 lb 9.6 oz (76.023 kg)  BMI 23.05 kg/m2  SpO2 100%   Eyes: Conjunctivae are normal.  Cardiovascular: Normal rate, regular rhythm and normal heart sounds.   Pulmonary/Chest: Effort normal and breath sounds normal.  Abdominal: Soft. Bowel sounds are normal. Hernia confirmed negative in the right inguinal area and confirmed negative in the left inguinal area.  Genitourinary: Testes normal.    Right testis shows no mass, no swelling and no tenderness. Right testis is descended. Cremasteric reflex is not absent on the right side. Left testis shows no mass, no swelling and no tenderness. Left testis is descended. Cremasteric reflex is not absent on the left side.  Circumcised.  Lymphadenopathy:       Right: No inguinal adenopathy present.       Left: No inguinal adenopathy present.  Neurological: He is alert and oriented to person, place, and time.  Skin: Skin is warm and dry.  Psychiatric: He has a normal mood and affect. His speech is normal and behavior is normal.   Results for orders placed or performed in visit on 04/13/14  POCT UA - Microscopic Only  Result Value Ref Range   WBC, Ur, HPF, POC 10-15    RBC, urine, microscopic 1-2    Bacteria, U Microscopic trace    Mucus, UA trace    Epithelial cells, urine per micros 1-4    Crystals, Ur, HPF, POC neg    Casts, Ur, LPF, POC neg    Yeast, UA neg   POCT urinalysis dipstick  Result Value Ref Range   Color, UA yellow    Clarity, UA cloudy    Glucose, UA neg    Bilirubin, UA neg    Ketones, UA neg    Spec Grav, UA 1.020    Blood, UA trace-intact    pH, UA 5.0    Protein, UA neg    Urobilinogen, UA 0.2    Nitrite, UA neg    Leukocytes, UA large (3+)            Assessment & Plan:  1. Penile discharge Treat for suspected GC urethritis. Await remaining results.  Counseled on safer sex practices and recommend abstinence x 2 weeks. - GC/Chlamydia Probe Amp - POCT UA - Microscopic Only - POCT urinalysis dipstick - cefTRIAXone (ROCEPHIN) injection 250 mg; Inject 0.25 g (250 mg total) into the muscle once. - azithromycin (ZITHROMAX) 250 MG tablet; Take 4 tablets (1,000 mg total) by mouth once.  Dispense: 4 tablet; Refill: 0  2. Routine screening for STI (sexually transmitted infection) Await results. - Hepatitis B surface antibody - Hepatitis B surface antigen - Hepatitis C antibody - HIV antibody - HSV(herpes simplex vrs) 1+2 ab-IgG - RPR   Fara Chute, PA-C Physician Assistant-Certified [facility]

## 2014-04-13 NOTE — Patient Instructions (Signed)
I will contact you with your lab results as soon as they are available.   If you have not heard from me in 2 weeks, please contact me.  The fastest way to get your results is to register for My Chart (see the instructions on the last page of this printout).  I recommend consistent condom use until you are ready to have another child.  I recommend abstinence from sexual activity for 2 weeks following treatment.

## 2014-04-14 LAB — RPR

## 2014-04-14 LAB — HIV ANTIBODY (ROUTINE TESTING W REFLEX)
HIV 1&2 Ab, 4th Generation: NONREACTIVE
HIV: NONREACTIVE

## 2014-04-15 LAB — HSV(HERPES SIMPLEX VRS) I + II AB-IGG
HSV 1 Glycoprotein G Ab, IgG: 0.13 IV
HSV 2 Glycoprotein G Ab, IgG: 8.69 IV — ABNORMAL HIGH

## 2014-04-15 LAB — HEPATITIS C ANTIBODY
HCV Ab: NEGATIVE
HCV Ab: NEGATIVE

## 2014-04-15 LAB — HEPATITIS B SURFACE ANTIBODY, QUANTITATIVE
HEPATITIS B-POST: 0.9 m[IU]/mL
Hepatitis B-Post: 0.9 m[IU]/mL

## 2014-04-15 LAB — HEPATITIS B SURFACE ANTIGEN
Hepatitis B Surface Ag: NEGATIVE
Hepatitis B Surface Ag: NEGATIVE

## 2014-04-17 ENCOUNTER — Encounter: Payer: Self-pay | Admitting: Physician Assistant

## 2014-04-17 ENCOUNTER — Other Ambulatory Visit: Payer: Self-pay | Admitting: Radiology

## 2014-04-17 DIAGNOSIS — Z23 Encounter for immunization: Secondary | ICD-10-CM | POA: Insufficient documentation

## 2014-04-17 DIAGNOSIS — R768 Other specified abnormal immunological findings in serum: Secondary | ICD-10-CM | POA: Insufficient documentation

## 2014-04-17 DIAGNOSIS — R7689 Other specified abnormal immunological findings in serum: Secondary | ICD-10-CM | POA: Insufficient documentation

## 2014-04-17 LAB — GC/CHLAMYDIA PROBE AMP
CT Probe RNA: POSITIVE — AB
GC Probe RNA: POSITIVE — AB

## 2014-04-17 MED ORDER — VALACYCLOVIR HCL 500 MG PO TABS
500.0000 mg | ORAL_TABLET | Freq: Every day | ORAL | Status: DC
Start: 2014-04-17 — End: 2016-03-15

## 2015-03-03 ENCOUNTER — Ambulatory Visit (INDEPENDENT_AMBULATORY_CARE_PROVIDER_SITE_OTHER): Payer: 59 | Admitting: Urgent Care

## 2015-03-03 VITALS — BP 136/82 | HR 72 | Temp 98.8°F | Resp 16 | Ht 69.75 in | Wt 168.8 lb

## 2015-03-03 DIAGNOSIS — R519 Headache, unspecified: Secondary | ICD-10-CM

## 2015-03-03 DIAGNOSIS — Z808 Family history of malignant neoplasm of other organs or systems: Secondary | ICD-10-CM | POA: Diagnosis not present

## 2015-03-03 DIAGNOSIS — Z23 Encounter for immunization: Secondary | ICD-10-CM | POA: Diagnosis not present

## 2015-03-03 DIAGNOSIS — Z202 Contact with and (suspected) exposure to infections with a predominantly sexual mode of transmission: Secondary | ICD-10-CM | POA: Diagnosis not present

## 2015-03-03 DIAGNOSIS — Z0000 Encounter for general adult medical examination without abnormal findings: Secondary | ICD-10-CM

## 2015-03-03 DIAGNOSIS — R51 Headache: Secondary | ICD-10-CM

## 2015-03-03 LAB — CBC
HCT: 45.3 % (ref 39.0–52.0)
HEMOGLOBIN: 15.5 g/dL (ref 13.0–17.0)
MCH: 28.5 pg (ref 26.0–34.0)
MCHC: 34.2 g/dL (ref 30.0–36.0)
MCV: 83.4 fL (ref 78.0–100.0)
MPV: 10.4 fL (ref 8.6–12.4)
PLATELETS: 257 10*3/uL (ref 150–400)
RBC: 5.43 MIL/uL (ref 4.22–5.81)
RDW: 13.6 % (ref 11.5–15.5)
WBC: 6.8 10*3/uL (ref 4.0–10.5)

## 2015-03-03 MED ORDER — AZITHROMYCIN 500 MG PO TABS: 500.0000 mg | ORAL_TABLET | Freq: Once | ORAL | Status: DC

## 2015-03-03 NOTE — Patient Instructions (Signed)

## 2015-03-03 NOTE — Progress Notes (Signed)
MRN: [id number]  Subjective:   [name] is a 25 y.o. male presenting for annual physical exam and possible STI.  Medical care team includes: PCP: No PCP Per Patient Specialists: None.   Patient is currently with a girlfriend. He states that she tested positive for chlamydia and would like treatment and testing for other STIs. Patient works for [employer], handles noxious and corrosive chemicals. States that he wears PPE but gets about 3 headaches per week. His headaches are achy in nature, over his occiput. These resolve with sleeping. His mother had brain mets from primary melanoma. He is worried that this is true for him. ROS as below. He does not try to eat healthily but exercises regularly, tries to hydrate very well. Denies smoking cigarettes or drinking alcohol.   Mason Quinn has a current medication list which includes the following prescription(s): valacyclovir. He has No Known Allergies.  Mason Quinn  has no past medical history on file. Also  has no past surgical history on file.  His family history is positive for melanoma with metastatic disease in his mother.   Immunizations: Does not get flu shots.  Review of Systems  Constitutional: Negative for fever, chills, weight loss, malaise/fatigue and diaphoresis.  HENT: Negative for congestion, ear discharge, ear pain, hearing loss, nosebleeds, sore throat and tinnitus.   Eyes: Negative for blurred vision, double vision, photophobia, pain, discharge and redness.  Respiratory: Negative for cough, shortness of breath and wheezing.   Cardiovascular: Negative for chest pain, palpitations and leg swelling.  Gastrointestinal: Negative for nausea, vomiting, abdominal pain, diarrhea, constipation and blood in stool.  Genitourinary: Negative for dysuria, urgency, frequency, hematuria and flank pain.  Musculoskeletal: Negative for myalgias, back pain and joint pain.  Skin: Negative for itching and rash.  Neurological: Positive for  headaches. Negative for dizziness, tingling, seizures, loss of consciousness and weakness.  Endo/Heme/Allergies: Negative for polydipsia.  Psychiatric/Behavioral: Negative for depression, suicidal ideas, hallucinations, memory loss and substance abuse. The patient is not nervous/anxious and does not have insomnia.      Objective:   Vitals: BP 136/82 mmHg  Pulse 72  Temp(Src) 98.8 F (37.1 C) (Oral)  Resp 16  Ht 5' 9.75" (1.772 m)  Wt 168 lb 12.8 oz (76.567 kg)  BMI 24.38 kg/m2  SpO2 97%  Physical Exam  Constitutional: He is oriented to person, place, and time. He appears well-developed and well-nourished.  HENT:  TM's intact bilaterally, no effusions or erythema. Nasal turbinates pink and moist, nasal passages patent. No sinus tenderness. Oropharynx clear, mucous membranes moist, dentition in good repair.  Eyes: Conjunctivae and EOM are normal. Pupils are equal, round, and reactive to light. Right eye exhibits no discharge. Left eye exhibits no discharge. No scleral icterus.  Neck: Normal range of motion. Neck supple. No thyromegaly present.  Cardiovascular: Normal rate, regular rhythm and intact distal pulses.  Exam reveals no gallop and no friction rub.   No murmur heard. Pulmonary/Chest: No stridor. No respiratory distress. He has no wheezes. He has no rales.  Abdominal: Soft. Bowel sounds are normal. He exhibits no distension and no mass. There is no tenderness.  Musculoskeletal: Normal range of motion. He exhibits no edema or tenderness.  Strength 5/5 throughout.  Lymphadenopathy:    He has no cervical adenopathy.  Neurological: He is alert and oriented to person, place, and time. He has normal reflexes. Coordination normal.  Skin: Skin is warm and dry. No rash noted. No erythema. No pallor.  Psychiatric: He has  a normal mood and affect.   Assessment and Plan :   1. Annual physical exam - Patient is pleasant young man - Discussed healthy lifestyle, diet, exercise,  preventative care, vaccinations, and addressed patient's concerns.  - RTC as needed   2. Possible exposure to STD - Cover for STI with azithromycin today, patient will rtc to clinic for labs only of his GC chlamydia probe.  3. Headache in back of head - Likely experiences stress headaches due to his work. Advised continued efforts at wearing his PPE, NSAID as needed, adequate daily rest, hydration and healthy meals. Counseled on worrisome signs and symptoms that would warrant further work up.  4. Family history of melanoma - Counseled on diagnosis and offered f/u if he needs help with depression and/or anxiety related to his mother's diagnosis.  5. Need for prophylactic vaccination and inoculation against influenza - Flu Vaccine QUAD 36+ mos IM   Jaynee Eagles, PA-C [facility] [phone] 03/03/2015  3:53 PM

## 2015-03-04 LAB — COMPREHENSIVE METABOLIC PANEL
ALBUMIN: 4.4 g/dL (ref 3.6–5.1)
ALT: 19 U/L (ref 9–46)
AST: 22 U/L (ref 10–40)
Alkaline Phosphatase: 49 U/L (ref 40–115)
BILIRUBIN TOTAL: 0.8 mg/dL (ref 0.2–1.2)
BUN: 9 mg/dL (ref 7–25)
CHLORIDE: 100 mmol/L (ref 98–110)
CO2: 31 mmol/L (ref 20–31)
CREATININE: 1.08 mg/dL (ref 0.60–1.35)
Calcium: 9.5 mg/dL (ref 8.6–10.3)
Glucose, Bld: 96 mg/dL (ref 65–99)
Potassium: 4.7 mmol/L (ref 3.5–5.3)
SODIUM: 139 mmol/L (ref 135–146)
Total Protein: 7.9 g/dL (ref 6.1–8.1)

## 2015-03-04 LAB — HIV ANTIBODY (ROUTINE TESTING W REFLEX): HIV: NONREACTIVE

## 2015-03-04 LAB — LIPID PANEL
Cholesterol: 170 mg/dL (ref 125–200)
HDL: 53 mg/dL (ref >=40)
LDL CALC: 103 mg/dL (ref <130)
TRIGLYCERIDES: 72 mg/dL (ref <150)
Total CHOL/HDL Ratio: 3.2 Ratio (ref <=5.0)
VLDL: 14 mg/dL (ref <30)

## 2015-03-04 LAB — RPR

## 2015-03-04 LAB — TSH: TSH: 1.24 m[IU]/L (ref 0.40–4.50)

## 2015-03-04 NOTE — Addendum Note (Signed)
Addended by: Lupe Carney on: 03/04/2015 10:45 AM   Modules accepted: Orders

## 2015-03-05 ENCOUNTER — Other Ambulatory Visit: Payer: Self-pay | Admitting: Urgent Care

## 2015-03-05 DIAGNOSIS — Z202 Contact with and (suspected) exposure to infections with a predominantly sexual mode of transmission: Secondary | ICD-10-CM

## 2015-03-05 LAB — GC/CHLAMYDIA PROBE AMP
CT Probe RNA: DETECTED — AB
GC Probe RNA: NOT DETECTED

## 2015-03-05 MED ORDER — AZITHROMYCIN 500 MG PO TABS: 500.0000 mg | ORAL_TABLET | Freq: Once | ORAL | Status: DC

## 2015-03-19 ENCOUNTER — Telehealth: Payer: Self-pay

## 2015-03-19 NOTE — Telephone Encounter (Signed)
Patient needs FMLA forms completed by [name], I have filled out what I could from the [facility] notes for the patient Mason Quinn DOB [birth year]). Patient needs it so that he can help take her to and from appointments and help her out around the house. I will place in your box on 03/19/15 please fill out and return to the FMLA/Disability box at the 102 checkout desk within 5-7 business days. Thank you!

## 2015-03-21 NOTE — Telephone Encounter (Signed)
Done

## 2016-03-15 ENCOUNTER — Ambulatory Visit (INDEPENDENT_AMBULATORY_CARE_PROVIDER_SITE_OTHER): Payer: Managed Care, Other (non HMO) | Admitting: Family Medicine

## 2016-03-15 VITALS — BP 140/78 | HR 76 | Temp 98.1°F | Resp 18 | Ht 69.75 in | Wt 168.0 lb

## 2016-03-15 DIAGNOSIS — R634 Abnormal weight loss: Secondary | ICD-10-CM | POA: Diagnosis not present

## 2016-03-15 DIAGNOSIS — Z113 Encounter for screening for infections with a predominantly sexual mode of transmission: Secondary | ICD-10-CM | POA: Diagnosis not present

## 2016-03-15 DIAGNOSIS — R519 Headache, unspecified: Secondary | ICD-10-CM

## 2016-03-15 DIAGNOSIS — N529 Male erectile dysfunction, unspecified: Secondary | ICD-10-CM | POA: Diagnosis not present

## 2016-03-15 DIAGNOSIS — R51 Headache: Secondary | ICD-10-CM

## 2016-03-15 DIAGNOSIS — F439 Reaction to severe stress, unspecified: Secondary | ICD-10-CM

## 2016-03-15 NOTE — Patient Instructions (Addendum)
Return for lab work between 8 and 10 in the morning the Saturday. Follow-up with me in the next few weeks. For now I would like to see you eat at least 2-3 meals per day, drink more water throughout the day.   See information below on stress, but I also have included a few numbers below for counselors in the area if you would like to meet with one.  Aaron Stewart: [phone] Claire Huprich: [phone]    Stress and Stress Management Stress is a normal reaction to life events. It is what you feel when life demands more than you are used to or more than you can handle. Some stress can be useful. For example, the stress reaction can help you catch the last bus of the day, study for a test, or meet a deadline at work. But stress that occurs too often or for too long can cause problems. It can affect your emotional health and interfere with relationships and normal daily activities. Too much stress can weaken your immune system and increase your risk for physical illness. If you already have a medical problem, stress can make it worse. What are the causes? All sorts of life events may cause stress. An event that causes stress for one person may not be stressful for another person. Major life events commonly cause stress. These may be positive or negative. Examples include losing your job, moving into a new home, getting married, having a baby, or losing a loved one. Less obvious life events may also cause stress, especially if they occur day after day or in combination. Examples include working long hours, driving in traffic, caring for children, being in debt, or being in a difficult relationship. What are the signs or symptoms? Stress may cause emotional symptoms including, the following:  Anxiety. This is feeling worried, afraid, on edge, overwhelmed, or out of control.  Anger. This is feeling irritated or impatient.  Depression. This is feeling sad, down, helpless, or guilty.  Difficulty focusing,  remembering, or making decisions. Stress may cause physical symptoms, including the following:  Aches and pains. These may affect your head, neck, back, stomach, or other areas of your body.  Tight muscles or clenched jaw.  Low energy or trouble sleeping. Stress may cause unhealthy behaviors, including the following:  Eating to feel better (overeating) or skipping meals.  Sleeping too little, too much, or both.  Working too much or putting off tasks (procrastination).  Smoking, drinking alcohol, or using drugs to feel better. How is this diagnosed? Stress is diagnosed through an assessment by your health care provider. Your health care provider will ask questions about your symptoms and any stressful life events.Your health care provider will also ask about your medical history and may order blood tests or other tests. Certain medical conditions and medicine can cause physical symptoms similar to stress. Mental illness can cause emotional symptoms and unhealthy behaviors similar to stress. Your health care provider may refer you to a mental health professional for further evaluation. How is this treated? Stress management is the recommended treatment for stress.The goals of stress management are reducing stressful life events and coping with stress in healthy ways. Techniques for reducing stressful life events include the following:  Stress identification. Self-monitor for stress and identify what causes stress for you. These skills may help you to avoid some stressful events.  Time management. Set your priorities, keep a calendar of events, and learn to say "no." These tools can help you avoid making   too many commitments. Techniques for coping with stress include the following:  Rethinking the problem. Try to think realistically about stressful events rather than ignoring them or overreacting. Try to find the positives in a stressful situation rather than focusing on the  negatives.  Exercise. Physical exercise can release both physical and emotional tension. The key is to find a form of exercise you enjoy and do it regularly.  Relaxation techniques. These relax the body and mind. Examples include yoga, meditation, tai chi, biofeedback, deep breathing, progressive muscle relaxation, listening to music, being out in nature, journaling, and other hobbies. Again, the key is to find one or more that you enjoy and can do regularly.  Healthy lifestyle. Eat a balanced diet, get plenty of sleep, and do not smoke. Avoid using alcohol or drugs to relax.  Strong support network. Spend time with family, friends, or other people you enjoy being around.Express your feelings and talk things over with someone you trust. Counseling or talktherapy with a mental health professional may be helpful if you are having difficulty managing stress on your own. Medicine is typically not recommended for the treatment of stress.Talk to your health care provider if you think you need medicine for symptoms of stress. Follow these instructions at home:  Keep all follow-up visits as directed by your health care provider.  Take all medicines as directed by your health care provider. Contact a health care provider if:  Your symptoms get worse or you start having new symptoms.  You feel overwhelmed by your problems and can no longer manage them on your own. Get help right away if:  You feel like hurting yourself or someone else. This information is not intended to replace advice given to you by your health care provider. Make sure you discuss any questions you have with your health care provider. Document Released: 06/23/2000 Document Revised: 06/05/2015 Document Reviewed: 08/22/2012 Elsevier Interactive Patient Education  2017 [employer].  IF you received an x-ray today, you will receive an invoice from [facility]. Please contact [facility] at [phone] with  questions or concerns regarding your invoice.   IF you received labwork today, you will receive an invoice from [facility]. Please contact [facility] at [phone] with questions or concerns regarding your invoice.   Our billing staff will not be able to assist you with questions regarding bills from these companies.  You will be contacted with the lab results as soon as they are available. The fastest way to get your results is to activate your My Chart account. Instructions are located on the last page of this paperwork. If you have not heard from [place] regarding the results in 2 weeks, please contact this office.

## 2016-03-15 NOTE — Progress Notes (Signed)
Subjective:    Patient ID: Mason Quinn, male    DOB: 1990/07/29, 26 y.o.   MRN: [id number]  HPI Mason Quinn is a 26 y.o. male  Some trouble with maintaining erection recently. In relationship with girlfriend since 2013, has 52 yo son. Has own place, but currently staying with mom to help with her care. Overall happy with current relationship. No other partners.   Helping to take care of mom who has stage 4 melanoma with brain metastases. He is her POA. And mom is in Hospice.  She has decided against any further treatment but unknown as far as amount of time of her illness. Met with therapist once in past, but didn't go back.   Has lost 20 pounds since last year. complains of bad headaches for past 2 months, on and off, not everyday.  Goes all day without eating at times. Notices headaches on days when not eating. Less active with prior activities, but denies depression.   Trouble maintaining erections past 3-4 months. Started after finding out about recurrence of Cancer in Mom.  Tried over the [employer], without significant relief. Is able to achieve erection, but difficulty maintaining erection. Is able to achieve morning erections. Does not have a specific time period. Usually just stops intercourse, does not try to restart.   No current or prior steroid supplements.    Patient Active Problem List   Diagnosis Date Noted  . 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 Need for hepatitis B vaccination 04/17/2014   No past medical history on file. No past surgical history on file. No Known Allergies Prior to Admission medications   Medication Sig Start Date End Date Taking? Authorizing Provider  azithromycin (ZITHROMAX) 500 MG tablet Take 1 tablet (500 mg total) by mouth once. Patient not taking: Reported on 03/15/2016 03/05/15   Jaynee Eagles, PA-C  valACYclovir (VALTREX) 500 MG tablet Take 1 tablet (500 mg total) by mouth daily. Patient not taking: Reported on 03/15/2016 04/17/14   Harrison Mons, PA-C   Social History   Social History  . Marital status: Single    Spouse name: n/a  . Number of children: 1  . Years of education: college   Occupational History  . Database administrator     makes paint   Social History Main Topics  . Smoking status: Never Smoker  . Smokeless tobacco: Never Used  . Alcohol use No  . Drug use: No  . Sexual activity: Yes    Partners: Female   Other Topics Concern  . Not on file   Social History Narrative   Lives alone.   Family lives in [place], [place].    Review of Systems  Constitutional: Positive for appetite change and unexpected weight change. Negative for chills and fever.  Genitourinary: Negative for difficulty urinating, discharge and genital sores.  Neurological: Positive for headaches. Negative for dizziness, tremors, weakness and light-headedness.  Psychiatric/Behavioral: Negative for behavioral problems and dysphoric mood. The patient is not nervous/anxious.    Depression screen [facility] 2/9 03/15/2016 03/03/2015  Decreased Interest 0 0  Down, Depressed, Hopeless 0 0  PHQ - 2 Score 0 0        Objective:   Physical Exam  Constitutional: He is oriented to person, place, and time. He appears well-developed and well-nourished.  HENT:  Head: Normocephalic and atraumatic.  Eyes: EOM are normal. Pupils are equal, round, and reactive to light.  Neck: No JVD present. Carotid bruit is not present.  Cardiovascular: Normal  rate, regular rhythm and normal heart sounds.   No murmur heard. Pulmonary/Chest: Effort normal and breath sounds normal. He has no rales.  Musculoskeletal: He exhibits no edema.  Neurological: He is alert and oriented to person, place, and time. No cranial nerve deficit. Coordination normal.  No pronator drift, normal finger to nose, normal heel to toe, nonfocal exam.  Skin: Skin is warm and dry.  Psychiatric: He has a normal mood and affect. His behavior is normal. Judgment and thought content normal.  Vitals  reviewed.  Vitals:   03/15/16 1803  BP: 140/78  Pulse: 76  Resp: 18  Temp: 98.1 F (36.7 C)  TempSrc: Oral  SpO2: 100%  Weight: 168 lb (76.2 kg)  Height: 5' 9.75" (1.772 m)   Wt Readings from Last 3 Encounters:  03/15/16 168 lb (76.2 kg)  03/03/15 168 lb 12.8 oz (76.6 kg)  04/13/14 167 lb 9.6 oz (76 kg)       Assessment & Plan:   Mason Quinn is a 26 y.o. male Nonintractable episodic headache, unspecified headache type  -Possibly multifactorial with decreased liquid intake throughout the day, some days without eating, and appears to be associated with those days when he has not eaten. Stress also likely contributor. Nonfocal neurologic exam.   - Increase fluid intake throughout the day, recommended 3 meals per day of all possible or snacks throughout the day. Recheck in the next 2 weeks to discuss symptoms further, consider neurology evaluation if no source found or if headaches persist.  Situational stress  -May have some component of adjustment disorder with taking care of his parent with stage IV cancer now on hospice. Denied depression. Counseling numbers were provided, encouraged him to discuss some of his symptoms and management of stress with a counselor.  Erectile dysfunction, unspecified erectile dysfunction type - Plan: Testosterone  -Likely psychologic with stressors as above. Will check testosterone level, but discussed open communication with partner regarding his symptoms and current stressors, as well as adjusting expectations and  stress regarding sexual health.   Loss of weight - Plan: HIV antibody, TSH  -Weighted overall stable on in office testing. We'll check STI testing at his request as well as TSH. Increase meals as above.  Routine screening for STI (sexually transmitted infection) - Plan: HIV antibody, RPR, GC/Chlamydia Probe Amp  -Denies new sexual contacts, but at his request we'll obtain routine STI screening and lab only visit within next  week.  Recommended follow-up in the next few weeks to discuss above symptoms further.  No orders of the defined types were placed in this encounter.  Patient Instructions   Return for lab work between 8 and 10 in the morning the Saturday. Follow-up with me in the next few weeks. For now I would like to see you eat at least 2-3 meals per day, drink more water throughout the day.   See information below on stress, but I also have included a few numbers below for counselors in the area if you would like to meet with one.  Karmen Bongo: [phone] [place]: [phone]    Stress and Stress Management Stress is a normal reaction to life events. It is what you feel when life demands more than you are used to or more than you can handle. Some stress can be useful. For example, the stress reaction can help you catch the last bus of the day, study for a test, or meet a deadline at work. But stress that occurs too often  or for too long can cause problems. It can affect your emotional health and interfere with relationships and normal daily activities. Too much stress can weaken your immune system and increase your risk for physical illness. If you already have a medical problem, stress can make it worse. What are the causes? All sorts of life events may cause stress. An event that causes stress for one person may not be stressful for another person. Major life events commonly cause stress. These may be positive or negative. Examples include losing your job, moving into a new home, getting married, having a baby, or losing a loved one. Less obvious life events may also cause stress, especially if they occur day after day or in combination. Examples include working long hours, driving in traffic, caring for children, being in debt, or being in a difficult relationship. What are the signs or symptoms? Stress may cause emotional symptoms including, the following:  Anxiety. This is feeling worried,  afraid, on edge, overwhelmed, or out of control.  Anger. This is feeling irritated or impatient.  Depression. This is feeling sad, down, helpless, or guilty.  Difficulty focusing, remembering, or making decisions. Stress may cause physical symptoms, including the following:  Aches and pains. These may affect your head, neck, back, stomach, or other areas of your body.  Tight muscles or clenched jaw.  Low energy or trouble sleeping. Stress may cause unhealthy behaviors, including the following:  Eating to feel better (overeating) or skipping meals.  Sleeping too little, too much, or both.  Working too much or putting off tasks (procrastination).  Smoking, drinking alcohol, or using drugs to feel better. How is this diagnosed? Stress is diagnosed through an assessment by your health care provider. Your health care provider will ask questions about your symptoms and any stressful life events.Your health care provider will also ask about your medical history and may order blood tests or other tests. Certain medical conditions and medicine can cause physical symptoms similar to stress. Mental illness can cause emotional symptoms and unhealthy behaviors similar to stress. Your health care provider may refer you to a mental health professional for further evaluation. How is this treated? Stress management is the recommended treatment for stress.The goals of stress management are reducing stressful life events and coping with stress in healthy ways. Techniques for reducing stressful life events include the following:  Stress identification. Self-monitor for stress and identify what causes stress for you. These skills may help you to avoid some stressful events.  Time management. Set your priorities, keep a calendar of events, and learn to say "no." These tools can help you avoid making too many commitments. Techniques for coping with stress include the following:  Rethinking the problem.  Try to think realistically about stressful events rather than ignoring them or overreacting. Try to find the positives in a stressful situation rather than focusing on the negatives.  Exercise. Physical exercise can release both physical and emotional tension. The key is to find a form of exercise you enjoy and do it regularly.  Relaxation techniques. These relax the body and mind. Examples include yoga, meditation, tai chi, biofeedback, deep breathing, progressive muscle relaxation, listening to music, being out in nature, journaling, and other hobbies. Again, the key is to find one or more that you enjoy and can do regularly.  Healthy lifestyle. Eat a balanced diet, get plenty of sleep, and do not smoke. Avoid using alcohol or drugs to relax.  Strong support network. Spend time with family, friends,  or other people you enjoy being around.Express your feelings and talk things over with someone you trust. Counseling or talktherapy with a mental health professional may be helpful if you are having difficulty managing stress on your own. Medicine is typically not recommended for the treatment of stress.Talk to your health care provider if you think you need medicine for symptoms of stress. Follow these instructions at home:  Keep all follow-up visits as directed by your health care provider.  Take all medicines as directed by your health care provider. Contact a health care provider if:  Your symptoms get worse or you start having new symptoms.  You feel overwhelmed by your problems and can no longer manage them on your own. Get help right away if:  You feel like hurting yourself or someone else. This information is not intended to replace advice given to you by your health care provider. Make sure you discuss any questions you have with your health care provider. Document Released: 06/23/2000 Document Revised: 06/05/2015 Document Reviewed: 08/22/2012 Elsevier Interactive Patient Education   2017 [employer].  IF you received an x-ray today, you will receive an invoice from [facility]. Please contact [facility] at [phone] with questions or concerns regarding your invoice.   IF you received labwork today, you will receive an invoice from [facility]. Please contact [facility] at [phone] with questions or concerns regarding your invoice.   Our billing staff will not be able to assist you with questions regarding bills from these companies.  You will be contacted with the lab results as soon as they are available. The fastest way to get your results is to activate your My Chart account. Instructions are located on the last page of this paperwork. If you have not heard from [place] regarding the results in 2 weeks, please contact this office.      Signed,   Merri Ray, MD [facility].  03/15/16 7:22 PM

## 2016-04-30 ENCOUNTER — Other Ambulatory Visit (INDEPENDENT_AMBULATORY_CARE_PROVIDER_SITE_OTHER): Payer: Managed Care, Other (non HMO) | Admitting: Family Medicine

## 2016-04-30 DIAGNOSIS — N529 Male erectile dysfunction, unspecified: Secondary | ICD-10-CM

## 2016-04-30 DIAGNOSIS — Z113 Encounter for screening for infections with a predominantly sexual mode of transmission: Secondary | ICD-10-CM

## 2016-04-30 DIAGNOSIS — R634 Abnormal weight loss: Secondary | ICD-10-CM

## 2016-05-01 LAB — RPR: RPR: NONREACTIVE

## 2016-05-01 LAB — HIV ANTIBODY (ROUTINE TESTING W REFLEX): HIV SCREEN 4TH GENERATION: NONREACTIVE

## 2016-05-01 LAB — TSH: TSH: 0.969 u[IU]/mL (ref 0.450–4.500)

## 2016-05-01 LAB — TESTOSTERONE: Testosterone: 560 ng/dL (ref 264–916)

## 2016-05-03 LAB — GC/CHLAMYDIA PROBE AMP
Chlamydia trachomatis, NAA: NEGATIVE
Neisseria gonorrhoeae by PCR: NEGATIVE

## 2016-05-05 ENCOUNTER — Encounter: Payer: Self-pay | Admitting: Radiology

## 2016-05-09 ENCOUNTER — Telehealth: Payer: Self-pay | Admitting: Family Medicine

## 2016-05-09 NOTE — Telephone Encounter (Signed)
Called after hours service --- reporting blood in stool.  [phone].  Spoke with patient --- Mason Quinn to bathroom, saw blood on tissue and in toilet; appeared that blood dripping in toilet.  One other episode in 2012 but not evaluated.  Recent constipation.  Burned with passing stool this morning; pain with bowel movement. Denies fever, abdominal pain, diarrhea.  Denies nausea or vomiting.  A/P: bright red blood on toilet paper with recent constipation: ddx includes anal fissure, hemorrhoids.  Recommend supportive care with fluids, fiber, stool softeners.  If recurrent blood with bowel movement today, recommend evaluation at urgent care or ED.  If no further blood with bowel movement, recommend appointment on Monday, 4/30; recommend patient call for an appointment at 8:00am.  Pt expressed understanding.  Mother passed away on April 24, 2016 of melanoma; pt worried about cancer.  Recommend discussing tomorrow at appointment.  Will warrant regular dermatology surveillance.

## 2016-05-10 ENCOUNTER — Ambulatory Visit (INDEPENDENT_AMBULATORY_CARE_PROVIDER_SITE_OTHER): Payer: Managed Care, Other (non HMO) | Admitting: Emergency Medicine

## 2016-05-10 ENCOUNTER — Encounter: Payer: Self-pay | Admitting: Emergency Medicine

## 2016-05-10 VITALS — BP 130/88 | HR 70 | Temp 98.2°F | Resp 16 | Ht 69.75 in | Wt 166.0 lb

## 2016-05-10 DIAGNOSIS — K602 Anal fissure, unspecified: Secondary | ICD-10-CM | POA: Insufficient documentation

## 2016-05-10 DIAGNOSIS — K625 Hemorrhage of anus and rectum: Secondary | ICD-10-CM | POA: Diagnosis not present

## 2016-05-10 DIAGNOSIS — K5900 Constipation, unspecified: Secondary | ICD-10-CM | POA: Insufficient documentation

## 2016-05-10 LAB — POCT CBC
Granulocyte percent: 82.1 %G — AB (ref 37–80)
HCT, POC: 42.5 % — AB (ref 43.5–53.7)
HEMOGLOBIN: 14.7 g/dL (ref 14.1–18.1)
LYMPH, POC: 1.3 (ref 0.6–3.4)
MCH: 29.5 pg (ref 27–31.2)
MCHC: 34.5 g/dL (ref 31.8–35.4)
MCV: 85.5 fL (ref 80–97)
MID (cbc): 0.2 (ref 0–0.9)
MPV: 7.9 fL (ref 0–99.8)
POC Granulocyte: 7.1 — AB (ref 2–6.9)
POC LYMPH PERCENT: 15.6 %L (ref 10–50)
POC MID %: 2.3 %M (ref 0–12)
Platelet Count, POC: 223 10*3/uL (ref 142–424)
RBC: 4.97 M/uL (ref 4.69–6.13)
RDW, POC: 12.7 %
WBC: 8.6 10*3/uL (ref 4.6–10.2)

## 2016-05-10 MED ORDER — POLYETHYLENE GLYCOL 3350 17 GM/SCOOP PO POWD: 17.0000 g | Freq: Two times a day (BID) | ORAL | 1 refills | Status: DC | PRN

## 2016-05-10 MED ORDER — DOCUSATE SODIUM 50 MG PO CAPS: 50.0000 mg | ORAL_CAPSULE | Freq: Two times a day (BID) | ORAL | 1 refills | Status: DC

## 2016-05-10 NOTE — Patient Instructions (Addendum)
IF you received an x-ray today, you will receive an invoice from [facility]. Please contact [facility] at [phone] with questions or concerns regarding your invoice.   IF you received labwork today, you will receive an invoice from [place]. Please contact [facility] at [phone] with questions or concerns regarding your invoice.   Our billing staff will not be able to assist you with questions regarding bills from these companies.  You will be contacted with the lab results as soon as they are available. The fastest way to get your results is to activate your My Chart account. Instructions are located on the last page of this paperwork. If you have not heard from [place] regarding the results in 2 weeks, please contact this office.     Lower Gastrointestinal Bleeding Lower gastrointestinal (GI) bleeding is the result of bleeding from the colon, rectum, or anal area. The colon is the last part of the digestive tract, where stool, also called feces, is formed. If you have lower GI bleeding, you may see blood in or on your stool. It may be bright red. Lower GI bleeding often stops without treatment. Continued or heavy bleeding needs emergency treatment at the hospital. What are the causes? Lower GI bleeding may be caused by:  A condition that causes pouches to form in the colon over time (diverticulosis).  Swelling and irritation (inflammation) in areas with diverticulosis (diverticulitis).  Inflammation of the colon (inflammatory bowel disease).  Swollen veins in the rectum (hemorrhoids).  Painful tears in the anus (anal fissures), often caused by passing hard stools.  Cancer of the colon or rectum.  Noncancerous growths (polyps) of the colon or rectum.  A bleeding disorder that impairs the formation of blood clots and causes easy bleeding (coagulopathy).  An abnormal weakening of a blood vessel where an artery and a vein come together (arteriovenous  malformation). What increases the risk? You are more likely to develop this condition if:  You are older than 26 years of age.  You take aspirin or NSAIDs on a regular basis.  You take anticoagulant or antiplatelet drugs.  You have a history of high-dose X-ray treatment (radiation therapy) of the colon.  You recently had a colon polyp removed. What are the signs or symptoms? Symptoms of this condition include:  Bright red blood or blood clots coming from your rectum.  Bloody stools.  Black or maroon-colored stools.  Pain or cramping in the abdomen.  Weakness or dizziness.  Racing heartbeat. How is this diagnosed? This condition may be diagnosed based on:  Your symptoms and medical history.  A physical exam. During the exam, your health care provider will check for signs of blood loss, such as low blood pressure and a rapid pulse.  Tests, such as:  Flexible sigmoidoscopy. In this procedure, a flexible tube with a camera on the end is used to examine your anus and the first part of your colon to look for the source of bleeding.  Colonoscopy. This is similar to a flexible sigmoidoscopy, but the camera can extend all the way to the uppermost part of your colon.  Blood tests to measure your red blood cell count and to check for coagulopathy.  An imaging study of your colon to look for a bleeding site. In some cases, you may have X-rays taken after a dye or radioactive substance is injected into your bloodstream (angiogram). How is this treated? Treatment for this condition depends on the cause of the bleeding. Heavy or  persistent bleeding is treated at the hospital. Treatment may include:  Getting fluids through an IV tube inserted into one of your veins.  Getting blood through an IV tube (blood transfusion).  Stopping bleeding through high-heat coagulation, injections of certain medicines, or applying surgical clips. This can all be done during a colonoscopy.  Having a  procedure that involves first doing an angiogram and then blocking blood flow to the bleeding site (embolization).  Stopping some of your regular medicines for a certain amount of time.  Having surgery to remove part of the colon. This may be needed if bleeding is severe and does not respond to other treatment. Follow these instructions at home:  Take over-the-counter and prescription medicines only as told by your health care provider. You may need to avoid aspirin, NSAIDs, or other medicines that increase bleeding.  Eat foods that are high in fiber. This will help keep your stools soft. These foods include whole grains, legumes, fruits, and vegetables. Eating 1-3 prunes each day works well for many people.  Drink enough fluid to keep your urine clear or pale yellow.  Keep all follow-up visits as told by your health care provider. This is important. Contact a health care provider if:  Your symptoms do not improve. Get help right away if:  Your bleeding increases.  You feel light-headed or you faint.  You feel weak.  You have severe cramps in your back or abdomen.  You pass large blood clots in your stool.  Your symptoms get worse. This information is not intended to replace advice given to you by your health care provider. Make sure you discuss any questions you have with your health care provider. Document Released: 05/15/2015 Document Revised: 06/05/2015 Document Reviewed: 05/15/2015 Elsevier Interactive Patient Education  2017 [place] Fissure, Adult An anal fissure is a small tear or crack in the skin around the opening of the butt (anus).Bleeding from the tear or crack usually stops on its own within a few minutes. The bleeding may happen every time you poop (have a bowel movement) until the tear or crack heals. Follow these instructions at home: Eating and drinking   Avoid bananas and dairy products. These foods can make it hard to poop.  Drink enough  fluid to keep your pee (urine) clear or pale yellow.  Eat a lot of fruit, whole grains, and vegetables. General instructions   Keep the butt area as clean and dry as you can.  Take a warm water bath (sitz bath) as told by your doctor. Do not use soap.  Take over-the-counter and prescription medicines only as told by your doctor.  Use creams or ointments only as told by your doctor.  Keep all follow-up visits as told by your doctor. This is important. Contact a doctor if:  You have more bleeding.  You have a fever.  You have watery poop (diarrhea) that is mixed with blood.  You have pain.  You problem gets worse, not better. This information is not intended to replace advice given to you by your health care provider. Make sure you discuss any questions you have with your health care provider. Document Released: 08/26/2010 Document Revised: 06/05/2015 Document Reviewed: 03/25/2014 Elsevier Interactive Patient Education  2017 [employer].

## 2016-05-10 NOTE — Progress Notes (Signed)
Mason Quinn 26 y.o.   Chief Complaint  Patient presents with  . Blood In Stools    SINCE 05/09/16    HISTORY OF PRESENT ILLNESS: This is a 26 y.o. male complaining of rectal bleeding since yesterday; has chronic constipation. Denies syncope, abdominal pain, fever, chills, n/v, or any other significant symptoms.  HPI   Prior to Admission medications   Medication Sig Start Date End Date Taking? Authorizing Provider  OVER THE COUNTER MEDICATION daily.   Yes Historical Provider, MD    No Known Allergies  Patient Active Problem List   Diagnosis Date Noted  . Rectal bleeding 05/10/2016  . Anal fissure 05/10/2016  . Constipation 05/10/2016  . 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 Need for hepatitis B vaccination 04/17/2014    No past medical history on file.  No past surgical history on file.  Social History   Social History  . Marital status: Single    Spouse name: n/a  . Number of children: 1  . Years of education: college   Occupational History  . Database administrator     makes paint   Social History Main Topics  . Smoking status: Never Smoker  . Smokeless tobacco: Never Used  . Alcohol use No  . Drug use: No  . Sexual activity: Yes    Partners: Female   Other Topics Concern  . Not on file   Social History Narrative   Lives alone.   Family lives in [place], [place].    No family history on file.   Review of Systems  Constitutional: Negative.  Negative for chills and fever.  HENT: Negative.   Eyes: Negative.   Respiratory: Negative.  Negative for cough and shortness of breath.   Cardiovascular: Negative.  Negative for chest pain and leg swelling.  Gastrointestinal: Positive for blood in stool. Negative for abdominal pain, constipation, diarrhea, heartburn, melena, nausea and vomiting.  Genitourinary: Negative.  Negative for dysuria and hematuria.  Musculoskeletal: Negative.   Skin: Negative.  Negative for rash.  Neurological: Negative for dizziness,  sensory change, focal weakness and headaches.  All other systems reviewed and are negative.    Vitals:   05/10/16 1341 05/10/16 1410  BP: (!) 151/84 130/88  Pulse: 70   Resp: 16   Temp: 98.2 F (36.8 C)      Physical Exam  Constitutional: He is oriented to person, place, and time. He appears well-developed and well-nourished.  HENT:  Head: Normocephalic and atraumatic.  Nose: Nose normal.  Mouth/Throat: Oropharynx is clear and moist. No oropharyngeal exudate.  Eyes: Conjunctivae and EOM are normal. Pupils are equal, round, and reactive to light.  Neck: Normal range of motion. Neck supple. No JVD present.  Cardiovascular: Normal rate, regular rhythm, normal heart sounds and intact distal pulses.   Pulmonary/Chest: Effort normal and breath sounds normal.  Abdominal: Soft. Bowel sounds are normal. He exhibits no distension. There is no tenderness.  Genitourinary: Rectal exam shows fissure.  Musculoskeletal: Normal range of motion.  Lymphadenopathy:    He has no cervical adenopathy.  Neurological: He is alert and oriented to person, place, and time. No sensory deficit. He exhibits normal muscle tone.  Skin: Skin is warm and dry. Capillary refill takes less than 2 seconds. No rash noted.  Psychiatric: He has a normal mood and affect. His behavior is normal.  Vitals reviewed.  Results for orders placed or performed in visit on 05/10/16 (from the past 24 hour(s))  POCT CBC     Status:  Abnormal   Collection Time: 05/10/16  2:29 PM  Result Value Ref Range   WBC 8.6 4.6 - 10.2 K/uL   Lymph, poc 1.3 0.6 - 3.4   POC LYMPH PERCENT 15.6 10 - 50 %L   MID (cbc) 0.2 0 - 0.9   POC MID % 2.3 0 - 12 %M   POC Granulocyte 7.1 (A) 2 - 6.9   Granulocyte percent 82.1 (A) 37 - 80 %G   RBC 4.97 4.69 - 6.13 M/uL   Hemoglobin 14.7 14.1 - 18.1 g/dL   HCT, POC 42.5 (A) 43.5 - 53.7 %   MCV 85.5 80 - 97 fL   MCH, POC 29.5 27 - 31.2 pg   MCHC 34.5 31.8 - 35.4 g/dL   RDW, POC 12.7 %   Platelet  Count, POC 223 142 - 424 K/uL   MPV 7.9 0 - 99.8 fL     ASSESSMENT & PLAN: Mason Quinn was seen today for blood in stools.  Diagnoses and all orders for this visit:  Rectal bleeding -     Comprehensive metabolic panel -     POCT CBC -     Ambulatory referral to Gastroenterology  Anal fissure  Constipation, unspecified constipation type  Other orders -     polyethylene glycol powder (GLYCOLAX/MIRALAX) powder; Take 17 g by mouth 2 (two) times daily as needed for moderate constipation. -     docusate sodium (COLACE) 50 MG capsule; Take 1 capsule (50 mg total) by mouth 2 (two) times daily.    Patient Instructions       IF you received an x-ray today, you will receive an invoice from [facility]. Please contact [facility] at [phone] with questions or concerns regarding your invoice.   IF you received labwork today, you will receive an invoice from [facility]. Please contact [facility] at [phone] with questions or concerns regarding your invoice.   Our billing staff will not be able to assist you with questions regarding bills from these companies.  You will be contacted with the lab results as soon as they are available. The fastest way to get your results is to activate your My Chart account. Instructions are located on the last page of this paperwork. If you have not heard from [place] regarding the results in 2 weeks, please contact this office.     Lower Gastrointestinal Bleeding Lower gastrointestinal (GI) bleeding is the result of bleeding from the colon, rectum, or anal area. The colon is the last part of the digestive tract, where stool, also called feces, is formed. If you have lower GI bleeding, you may see blood in or on your stool. It may be bright red. Lower GI bleeding often stops without treatment. Continued or heavy bleeding needs emergency treatment at the hospital. What are the causes? Lower GI bleeding may be caused by:  A condition that  causes pouches to form in the colon over time (diverticulosis).  Swelling and irritation (inflammation) in areas with diverticulosis (diverticulitis).  Inflammation of the colon (inflammatory bowel disease).  Swollen veins in the rectum (hemorrhoids).  Painful tears in the anus (anal fissures), often caused by passing hard stools.  Cancer of the colon or rectum.  Noncancerous growths (polyps) of the colon or rectum.  A bleeding disorder that impairs the formation of blood clots and causes easy bleeding (coagulopathy).  An abnormal weakening of a blood vessel where an artery and a vein come together (arteriovenous malformation). What increases the risk? You are more likely  to develop this condition if:  You are older than 26 years of age.  You take aspirin or NSAIDs on a regular basis.  You take anticoagulant or antiplatelet drugs.  You have a history of high-dose X-ray treatment (radiation therapy) of the colon.  You recently had a colon polyp removed. What are the signs or symptoms? Symptoms of this condition include:  Bright red blood or blood clots coming from your rectum.  Bloody stools.  Black or maroon-colored stools.  Pain or cramping in the abdomen.  Weakness or dizziness.  Racing heartbeat. How is this diagnosed? This condition may be diagnosed based on:  Your symptoms and medical history.  A physical exam. During the exam, your health care provider will check for signs of blood loss, such as low blood pressure and a rapid pulse.  Tests, such as:  Flexible sigmoidoscopy. In this procedure, a flexible tube with a camera on the end is used to examine your anus and the first part of your colon to look for the source of bleeding.  Colonoscopy. This is similar to a flexible sigmoidoscopy, but the camera can extend all the way to the uppermost part of your colon.  Blood tests to measure your red blood cell count and to check for coagulopathy.  An imaging  study of your colon to look for a bleeding site. In some cases, you may have X-rays taken after a dye or radioactive substance is injected into your bloodstream (angiogram). How is this treated? Treatment for this condition depends on the cause of the bleeding. Heavy or persistent bleeding is treated at the hospital. Treatment may include:  Getting fluids through an IV tube inserted into one of your veins.  Getting blood through an IV tube (blood transfusion).  Stopping bleeding through high-heat coagulation, injections of certain medicines, or applying surgical clips. This can all be done during a colonoscopy.  Having a procedure that involves first doing an angiogram and then blocking blood flow to the bleeding site (embolization).  Stopping some of your regular medicines for a certain amount of time.  Having surgery to remove part of the colon. This may be needed if bleeding is severe and does not respond to other treatment. Follow these instructions at home:  Take over-the-counter and prescription medicines only as told by your health care provider. You may need to avoid aspirin, NSAIDs, or other medicines that increase bleeding.  Eat foods that are high in fiber. This will help keep your stools soft. These foods include whole grains, legumes, fruits, and vegetables. Eating 1-3 prunes each day works well for many people.  Drink enough fluid to keep your urine clear or pale yellow.  Keep all follow-up visits as told by your health care provider. This is important. Contact a health care provider if:  Your symptoms do not improve. Get help right away if:  Your bleeding increases.  You feel light-headed or you faint.  You feel weak.  You have severe cramps in your back or abdomen.  You pass large blood clots in your stool.  Your symptoms get worse. This information is not intended to replace advice given to you by your health care provider. Make sure you discuss any questions  you have with your health care provider. Document Released: 05/15/2015 Document Revised: 06/05/2015 Document Reviewed: 05/15/2015 Elsevier Interactive Patient Education  2017 [place] Fissure, Adult An anal fissure is a small tear or crack in the skin around the opening of the butt (anus).Bleeding  from the tear or crack usually stops on its own within a few minutes. The bleeding may happen every time you poop (have a bowel movement) until the tear or crack heals. Follow these instructions at home: Eating and drinking   Avoid bananas and dairy products. These foods can make it hard to poop.  Drink enough fluid to keep your pee (urine) clear or pale yellow.  Eat a lot of fruit, whole grains, and vegetables. General instructions   Keep the butt area as clean and dry as you can.  Take a warm water bath (sitz bath) as told by your doctor. Do not use soap.  Take over-the-counter and prescription medicines only as told by your doctor.  Use creams or ointments only as told by your doctor.  Keep all follow-up visits as told by your doctor. This is important. Contact a doctor if:  You have more bleeding.  You have a fever.  You have watery poop (diarrhea) that is mixed with blood.  You have pain.  You problem gets worse, not better. This information is not intended to replace advice given to you by your health care provider. Make sure you discuss any questions you have with your health care provider. Document Released: 08/26/2010 Document Revised: 06/05/2015 Document Reviewed: 03/25/2014 Elsevier Interactive Patient Education  2017 Elsevier Inc.      Agustina Caroli, MD [facility]

## 2016-05-11 LAB — COMPREHENSIVE METABOLIC PANEL
ALBUMIN: 4.3 g/dL (ref 3.5–5.5)
ALT: 31 IU/L (ref 0–44)
AST: 25 IU/L (ref 0–40)
Albumin/Globulin Ratio: 1.6 (ref 1.2–2.2)
Alkaline Phosphatase: 51 IU/L (ref 39–117)
BUN / CREAT RATIO: 9 (ref 9–20)
BUN: 10 mg/dL (ref 6–20)
Bilirubin Total: 0.6 mg/dL (ref 0.0–1.2)
CO2: 26 mmol/L (ref 18–29)
CREATININE: 1.11 mg/dL (ref 0.76–1.27)
Calcium: 9.4 mg/dL (ref 8.7–10.2)
Chloride: 99 mmol/L (ref 96–106)
GFR calc non Af Amer: 92 mL/min/{1.73_m2} (ref >59)
GFR, EST AFRICAN AMERICAN: 106 mL/min/{1.73_m2} (ref >59)
GLUCOSE: 79 mg/dL (ref 65–99)
Globulin, Total: 2.7 g/dL (ref 1.5–4.5)
Potassium: 4 mmol/L (ref 3.5–5.2)
Sodium: 139 mmol/L (ref 134–144)
TOTAL PROTEIN: 7 g/dL (ref 6.0–8.5)

## 2016-07-12 ENCOUNTER — Encounter: Payer: Self-pay | Admitting: Emergency Medicine

## 2016-08-26 ENCOUNTER — Encounter: Payer: Self-pay | Admitting: Emergency Medicine

## 2016-08-26 ENCOUNTER — Ambulatory Visit (INDEPENDENT_AMBULATORY_CARE_PROVIDER_SITE_OTHER): Payer: Self-pay | Admitting: Emergency Medicine

## 2016-08-26 VITALS — BP 130/74 | HR 79 | Temp 98.6°F | Resp 16 | Ht 69.5 in | Wt 163.2 lb

## 2016-08-26 DIAGNOSIS — R399 Unspecified symptoms and signs involving the genitourinary system: Secondary | ICD-10-CM

## 2016-08-26 DIAGNOSIS — R4582 Worries: Secondary | ICD-10-CM

## 2016-08-26 DIAGNOSIS — Z711 Person with feared health complaint in whom no diagnosis is made: Secondary | ICD-10-CM

## 2016-08-26 DIAGNOSIS — R3989 Other symptoms and signs involving the genitourinary system: Secondary | ICD-10-CM

## 2016-08-26 MED ORDER — CEFTRIAXONE SODIUM 250 MG IJ SOLR
250.0000 mg | Freq: Once | INTRAMUSCULAR | Status: AC
  Administered 2016-08-26: 250 mg via INTRAMUSCULAR

## 2016-08-26 MED ORDER — AZITHROMYCIN 1 G PO PACK: 1.0000 g | PACK | Freq: Once | ORAL | 0 refills | Status: AC

## 2016-08-26 NOTE — Progress Notes (Signed)
Mason Quinn 26 y.o.   Chief Complaint  Patient presents with  . STD TESTING    ALL TEST for possible exposure    HISTORY OF PRESENT ILLNESS: This is a 26 y.o. male complaining of slight burning on urination that started this am; had unprotected intercourse yesterday. No other significant symptoms.  HPI   Prior to Admission medications   Medication Sig Start Date End Date Taking? Authorizing Provider  OVER THE COUNTER MEDICATION daily.   Yes [provider]  docusate sodium (COLACE) 50 MG capsule Take 1 capsule (50 mg total) by mouth 2 (two) times daily. Patient not taking: Reported on 08/26/2016 05/10/16   Horald Pollen, MD  polyethylene glycol powder [facility]) powder Take 17 g by mouth 2 (two) times daily as needed for moderate constipation. Patient not taking: Reported on 08/26/2016 05/10/16   Horald Pollen, MD    No Known Allergies  Patient Active Problem List   Diagnosis Date Noted  . Rectal bleeding 05/10/2016  . Anal fissure 05/10/2016  . Constipation 05/10/2016  . 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 Need for hepatitis B vaccination 04/17/2014    No past medical history on file.  No past surgical history on file.  Social History   Social History  . Marital status: Single    Spouse name: n/a  . Number of children: 1  . Years of education: college   Occupational History  . Database administrator     makes paint   Social History Main Topics  . Smoking status: Never Smoker  . Smokeless tobacco: Never Used  . Alcohol use No  . Drug use: No  . Sexual activity: Yes    Partners: Female   Other Topics Concern  . Not on file   Social History Narrative   Lives alone.   Family lives in [place], [place].    No family history on file.   Review of Systems  Constitutional: Negative.  Negative for chills and fever.  Gastrointestinal: Negative for abdominal pain, nausea and vomiting.  Genitourinary: Positive for dysuria. Negative  for flank pain and hematuria.  Skin: Negative for rash.  Neurological: Negative for dizziness and headaches.  All other systems reviewed and are negative.  Vitals:   08/26/16 1526  BP: 130/74  Pulse: 79  Resp: 16  Temp: 98.6 F (37 C)  SpO2: 99%     Physical Exam  Constitutional: He is oriented to person, place, and time. He appears well-developed and well-nourished.  HENT:  Head: Normocephalic and atraumatic.  Eyes: Pupils are equal, round, and reactive to light. EOM are normal.  Neck: Normal range of motion.  Cardiovascular: Normal rate and regular rhythm.   Pulmonary/Chest: Effort normal.  Abdominal: Soft. There is no tenderness.  Musculoskeletal: Normal range of motion.  Neurological: He is alert and oriented to person, place, and time. No sensory deficit. He exhibits normal muscle tone.  Skin: Skin is warm and dry. Capillary refill takes less than 2 seconds. No rash noted.  Psychiatric: He has a normal mood and affect. His behavior is normal.     ASSESSMENT & PLAN: Mason Quinn was seen today for std testing.  Diagnoses and all orders for this visit:  Lower urinary tract symptoms (LUTS) -     GC/Chlamydia Probe Amp -     Urine Culture -     cefTRIAXone (ROCEPHIN) injection 250 mg; Inject 250 mg into the muscle once.  Concern about STD in male without diagnosis  Worries  Other  orders -     Cancel: Tdap vaccine greater than or equal to 7yo IM -     azithromycin (ZITHROMAX) 1 g powder; Take 1 packet (1 g total) by mouth once.   Patient Instructions       IF you received an x-ray today, you will receive an invoice from [facility]. Please contact [facility] at [phone] with questions or concerns regarding your invoice.   IF you received labwork today, you will receive an invoice from [facility]. Please contact [facility] at [phone] with questions or concerns regarding your invoice.   Our billing staff will not be able to assist you with  questions regarding bills from these companies.  You will be contacted with the lab results as soon as they are available. The fastest way to get your results is to activate your My Chart account. Instructions are located on the last page of this paperwork. If you have not heard from [place] regarding the results in 2 weeks, please contact this office.    Sexually Transmitted Disease A sexually transmitted disease (STD) is a disease or infection that may be passed (transmitted) from person to person, usually during sexual activity. This may happen by way of saliva, semen, blood, vaginal mucus, or urine. Common STDs include:  Gonorrhea.  Chlamydia.  Syphilis.  HIV and AIDS.  Genital herpes.  Hepatitis B and C.  Trichomonas.  Human papillomavirus (HPV).  Pubic lice.  Scabies.  Mites.  Bacterial vaginosis.  What are the causes? An STD may be caused by bacteria, a virus, or parasites. STDs are often transmitted during sexual activity if one person is infected. However, they may also be transmitted through nonsexual means. STDs may be transmitted after:  Sexual intercourse with an infected person.  Sharing sex toys with an infected person.  Sharing needles with an infected person or using unclean piercing or tattoo needles.  Having intimate contact with the genitals, mouth, or rectal areas of an infected person.  Exposure to infected fluids during birth.  What are the signs or symptoms? Different STDs have different symptoms. Some people may not have any symptoms. If symptoms are present, they may include:  Painful or bloody urination.  Pain in the pelvis, abdomen, vagina, anus, throat, or eyes.  A skin rash, itching, or irritation.  Growths, ulcerations, blisters, or sores in the genital and anal areas.  Abnormal vaginal discharge with or without bad odor.  Penile discharge in men.  Fever.  Pain or bleeding during sexual intercourse.  Swollen glands in the groin  area.  Yellow skin and eyes (jaundice). This is seen with hepatitis.  Swollen testicles.  Infertility.  Sores and blisters in the mouth.  How is this diagnosed? To make a diagnosis, your health care provider may:  Take a medical history.  Perform a physical exam.  Take a sample of any discharge to examine.  Swab the throat, cervix, opening to the penis, rectum, or vagina for testing.  Test a sample of your first morning urine.  Perform blood tests.  Perform a Pap test, if this applies.  Perform a colposcopy.  Perform a laparoscopy.  How is this treated? Treatment depends on the STD. Some STDs may be treated but not cured.  Chlamydia, gonorrhea, trichomonas, and syphilis can be cured with antibiotic medicine.  Genital herpes, hepatitis, and HIV can be treated, but not cured, with prescribed medicines. The medicines lessen symptoms.  Genital warts from HPV can be treated with medicine or by freezing, burning (  electrocautery), or surgery. Warts may come back.  HPV cannot be cured with medicine or surgery. However, abnormal areas may be removed from the cervix, vagina, or vulva.  If your diagnosis is confirmed, your recent sexual partners need treatment. This is true even if they are symptom-free or have a negative culture or evaluation. They should not have sex until their health care providers say it is okay.  Your health care provider may test you for infection again 3 months after treatment.  How is this prevented? Take these steps to reduce your risk of getting an STD:  Use latex condoms, dental dams, and water-soluble lubricants during sexual activity. Do not use petroleum jelly or oils.  Avoid having multiple sex partners.  Do not have sex with someone who has other sex partners.  Do not have sex with anyone you do not know or who is at high risk for an STD.  Avoid risky sex practices that can break your skin.  Do not have sex if you have open sores on  your mouth or skin.  Avoid drinking too much alcohol or taking illegal drugs. Alcohol and drugs can affect your judgment and put you in a vulnerable position.  Avoid engaging in oral and anal sex acts.  Get vaccinated for HPV and hepatitis. If you have not received these vaccines in the past, talk to your health care provider about whether one or both might be right for you.  If you are at risk of being infected with HIV, it is recommended that you take a prescription medicine daily to prevent HIV infection. This is called pre-exposure prophylaxis (PrEP). You are considered at risk if: ? You are a man who has sex with other men (MSM). ? You are a heterosexual man or woman and are sexually active with more than one partner. ? You take drugs by injection. ? You are sexually active with a partner who has HIV.  Talk with your health care provider about whether you are at high risk of being infected with HIV. If you choose to begin PrEP, you should first be tested for HIV. You should then be tested every 3 months for as long as you are taking PrEP.  Contact a health care provider if:  See your health care provider.  Tell your sexual partner(s). They should be tested and treated for any STDs.  Do not have sex until your health care provider says it is okay. Get help right away if: Contact your health care provider right away if:  You have severe abdominal pain.  You are a man and notice swelling or pain in your testicles.  You are a woman and notice swelling or pain in your vagina.  This information is not intended to replace advice given to you by your health care provider. Make sure you discuss any questions you have with your health care provider. Document Released: 03/20/2002 Document Revised: 07/18/2015 Document Reviewed: 07/18/2012 Elsevier Interactive Patient Education  2018 [employer]vier Inc.      Agustina Caroli, MD [facility]

## 2016-08-26 NOTE — Patient Instructions (Addendum)
   IF you received an x-ray today, you will receive an invoice from [facility]. Please contact [facility] at [phone] with questions or concerns regarding your invoice.   IF you received labwork today, you will receive an invoice from [facility]. Please contact [facility] at [phone] with questions or concerns regarding your invoice.   Our billing staff will not be able to assist you with questions regarding bills from these companies.  You will be contacted with the lab results as soon as they are available. The fastest way to get your results is to activate your My Chart account. Instructions are located on the last page of this paperwork. If you have not heard from us regarding the results in 2 weeks, please contact this office.     Sexually Transmitted Disease A sexually transmitted disease (STD) is a disease or infection that may be passed (transmitted) from person to person, usually during sexual activity. This may happen by way of saliva, semen, blood, vaginal mucus, or urine. Common STDs include:  Gonorrhea.  Chlamydia.  Syphilis.  HIV and AIDS.  Genital herpes.  Hepatitis B and C.  Trichomonas.  Human papillomavirus (HPV).  Pubic lice.  Scabies.  Mites.  Bacterial vaginosis.  What are the causes? An STD may be caused by bacteria, a virus, or parasites. STDs are often transmitted during sexual activity if one person is infected. However, they may also be transmitted through nonsexual means. STDs may be transmitted after:  Sexual intercourse with an infected person.  Sharing sex toys with an infected person.  Sharing needles with an infected person or using unclean piercing or tattoo needles.  Having intimate contact with the genitals, mouth, or rectal areas of an infected person.  Exposure to infected fluids during birth.  What are the signs or symptoms? Different STDs have different symptoms. Some people may not have any  symptoms. If symptoms are present, they may include:  Painful or bloody urination.  Pain in the pelvis, abdomen, vagina, anus, throat, or eyes.  A skin rash, itching, or irritation.  Growths, ulcerations, blisters, or sores in the genital and anal areas.  Abnormal vaginal discharge with or without bad odor.  Penile discharge in men.  Fever.  Pain or bleeding during sexual intercourse.  Swollen glands in the groin area.  Yellow skin and eyes (jaundice). This is seen with hepatitis.  Swollen testicles.  Infertility.  Sores and blisters in the mouth.  How is this diagnosed? To make a diagnosis, your health care provider may:  Take a medical history.  Perform a physical exam.  Take a sample of any discharge to examine.  Swab the throat, cervix, opening to the penis, rectum, or vagina for testing.  Test a sample of your first morning urine.  Perform blood tests.  Perform a Pap test, if this applies.  Perform a colposcopy.  Perform a laparoscopy.  How is this treated? Treatment depends on the STD. Some STDs may be treated but not cured.  Chlamydia, gonorrhea, trichomonas, and syphilis can be cured with antibiotic medicine.  Genital herpes, hepatitis, and HIV can be treated, but not cured, with prescribed medicines. The medicines lessen symptoms.  Genital warts from HPV can be treated with medicine or by freezing, burning (electrocautery), or surgery. Warts may come back.  HPV cannot be cured with medicine or surgery. However, abnormal areas may be removed from the cervix, vagina, or vulva.  If your diagnosis is confirmed, your recent sexual partners need treatment. This is   true even if they are symptom-free or have a negative culture or evaluation. They should not have sex until their health care providers say it is okay.  Your health care provider may test you for infection again 3 months after treatment.  How is this prevented? Take these steps to reduce  your risk of getting an STD:  Use latex condoms, dental dams, and water-soluble lubricants during sexual activity. Do not use petroleum jelly or oils.  Avoid having multiple sex partners.  Do not have sex with someone who has other sex partners.  Do not have sex with anyone you do not know or who is at high risk for an STD.  Avoid risky sex practices that can break your skin.  Do not have sex if you have open sores on your mouth or skin.  Avoid drinking too much alcohol or taking illegal drugs. Alcohol and drugs can affect your judgment and put you in a vulnerable position.  Avoid engaging in oral and anal sex acts.  Get vaccinated for HPV and hepatitis. If you have not received these vaccines in the past, talk to your health care provider about whether one or both might be right for you.  If you are at risk of being infected with HIV, it is recommended that you take a prescription medicine daily to prevent HIV infection. This is called pre-exposure prophylaxis (PrEP). You are considered at risk if: ? You are a man who has sex with other men (MSM). ? You are a heterosexual man or woman and are sexually active with more than one partner. ? You take drugs by injection. ? You are sexually active with a partner who has HIV.  Talk with your health care provider about whether you are at high risk of being infected with HIV. If you choose to begin PrEP, you should first be tested for HIV. You should then be tested every 3 months for as long as you are taking PrEP.  Contact a health care provider if:  See your health care provider.  Tell your sexual partner(s). They should be tested and treated for any STDs.  Do not have sex until your health care provider says it is okay. Get help right away if: Contact your health care provider right away if:  You have severe abdominal pain.  You are a man and notice swelling or pain in your testicles.  You are a woman and notice swelling or pain  in your vagina.  This information is not intended to replace advice given to you by your health care provider. Make sure you discuss any questions you have with your health care provider. Document Released: 03/20/2002 Document Revised: 07/18/2015 Document Reviewed: 07/18/2012 Elsevier Interactive Patient Education  2018 Elsevier Inc.  

## 2016-08-27 LAB — GC/CHLAMYDIA PROBE AMP
Chlamydia trachomatis, NAA: NEGATIVE
Neisseria gonorrhoeae by PCR: NEGATIVE

## 2016-08-28 LAB — URINE CULTURE

## 2017-03-26 ENCOUNTER — Encounter: Payer: Self-pay | Admitting: Physician Assistant

## 2017-03-26 ENCOUNTER — Other Ambulatory Visit: Payer: Self-pay

## 2017-03-26 ENCOUNTER — Ambulatory Visit (INDEPENDENT_AMBULATORY_CARE_PROVIDER_SITE_OTHER): Payer: Self-pay | Admitting: Physician Assistant

## 2017-03-26 VITALS — BP 110/76 | HR 60 | Temp 98.2°F | Resp 16 | Ht 70.0 in | Wt 166.2 lb

## 2017-03-26 DIAGNOSIS — Z113 Encounter for screening for infections with a predominantly sexual mode of transmission: Secondary | ICD-10-CM

## 2017-03-26 DIAGNOSIS — N4889 Other specified disorders of penis: Secondary | ICD-10-CM

## 2017-03-26 DIAGNOSIS — Z808 Family history of malignant neoplasm of other organs or systems: Secondary | ICD-10-CM

## 2017-03-26 DIAGNOSIS — Z130 Encounter for screening for diseases of the blood and blood-forming organs and certain disorders involving the immune mechanism: Secondary | ICD-10-CM

## 2017-03-26 DIAGNOSIS — Z13220 Encounter for screening for lipoid disorders: Secondary | ICD-10-CM

## 2017-03-26 DIAGNOSIS — R399 Unspecified symptoms and signs involving the genitourinary system: Secondary | ICD-10-CM

## 2017-03-26 DIAGNOSIS — Z0000 Encounter for general adult medical examination without abnormal findings: Secondary | ICD-10-CM

## 2017-03-26 DIAGNOSIS — Z1329 Encounter for screening for other suspected endocrine disorder: Secondary | ICD-10-CM

## 2017-03-26 LAB — POCT URINALYSIS DIP (MANUAL ENTRY)
Bilirubin, UA: NEGATIVE
Blood, UA: NEGATIVE
Glucose, UA: NEGATIVE mg/dL
Ketones, POC UA: NEGATIVE mg/dL
Leukocytes, UA: NEGATIVE
Nitrite, UA: NEGATIVE
Protein Ur, POC: NEGATIVE mg/dL
Spec Grav, UA: 1.02 (ref 1.010–1.025)
Urobilinogen, UA: 0.2 E.U./dL
pH, UA: 8 (ref 5.0–8.0)

## 2017-03-26 LAB — POC MICROSCOPIC URINALYSIS (UMFC): Mucus: ABSENT

## 2017-03-26 NOTE — Patient Instructions (Addendum)
We will contact you with the results of your labs when they come back. If needed, I will send in an antibiotic to your pharmacy. FYI You may need to come back for a shot.  You will get a call to schedule an appointment with dermatology.   Work on [employer] and getting in regular exercise.   These are some of my general health and wellness recommendations. Some of them may apply to you better than others. Please use common sense as you try these suggestions and feel free to ask me any questions.   ACTIVITY/FITNESS Mental, social, emotional and physical stimulation are very important for brain and body health. Try learning a new activity (arts, music, language, sports, games).  Keep moving your body to the best of your abilities. You can do this at home, inside or outside, the park, community center, gym or anywhere you like. Consider a physical therapist or personal trainer to get started. Consider the app Sworkit. Fitness trackers such as smart-watches, smart-phones or Fitbits can help as well.   NUTRITION Eat more plants: colorful vegetables, nuts, seeds and berries. Eat less sugar, salt, preservatives and processed foods. Avoid toxins such as cigarettes and alcohol. Drink water when you are thirsty. Warm water with a slice of lemon is an excellent morning drink to start the day.  Consider these websites for more information The Nutrition Source (https://www.henry-hernandez.biz/) Precision Nutrition (WindowBlog.ch)   RELAXATION Consider practicing mindfulness meditation or other relaxation techniques such as deep breathing, prayer, yoga, tai chi, massage. See website mindful.org or the apps Headspace or Calm to help get started.   SLEEP Try to get at least 7-8+ hours sleep per day. Regular exercise and reduced caffeine will help you sleep better. Practice good sleep hygeine techniques. See website sleep.org for more  information.   PLANNING Prepare estate planning, living will, healthcare POA documents. Sometimes this is best planned with the help of an attorney. Theconversationproject.org and agingwithdignity.org are excellent resources.  Try to shop mostly along the perimeter of the grocery store. Cut down consumption of processed foods.   The following foods are the foundation of a heart-healthy diet: Vegetables such as greens (spinach, collard greens, kale), broccoli, cabbage, carrots, bell peppers; stay away from starchy vegetables like potatoes, carrots, peas Fruits such as avocados, apples, berries, bananas, oranges, pears, grapes, and prunes  Whole grains such as plain oatmeal, brown rice, and whole-grain bread or tortillas  Fat-free or low-fat dairy foods such as milk, cheese, or yogurt  Protein-rich foods:  Fish high in omega-3 fatty acids, such as salmon, tuna, and trout, about 8 ounces a week  Lean meats such as 95 percent lean ground beef or pork tenderloin  Poultry such as skinless chicken or [place]  Eggs  Nuts, seeds, and soy products: quinoa, chia seeds Legumes such as kidney beans, lentils, chickpeas, black-eyed peas, and lima beans Oils and foods containing high levels of monounsaturated and polyunsaturated fats that can help lower blood cholesterol levels and the risk of cardiovascular disease. Some sources of these oils are:  Canola, corn, olive, safflower, sesame, sunflower, and soybean oils  Nuts such as walnuts, almonds, and pine nuts  Nut and seed butters  Salmon and trout  Seeds such as sesame, sunflower, pumpkin, or flax  Avocados  Tofu  Brussel sprouts - Cut off stems. Place in a mixing bowl that has a lid. Pour in a 1/4-1/2 cup olive oil, spices, use a light amount of parmesan. Place on a baking sheet.  Bake for 10 minutes at 400F. Take it out, eat the brussel chips. Place for another 5-10 minutes.   Mashed cauliflower - Boil a bunch of cauliflower in a pot of water.  Blend in a food processor with 1-2 tablespoons of butter.  Spaghetti squash -  Cut the squash in half very carefully, clean out seeds from the middle. Place 1/2 face down in a microwave safe dish with at least 2 inches of water. Make 4-6 slits on outside of spaghetti squash and microwave for 10-12 minutes. Take out the spaghetti using a metal spoon. Repeat for the other half.   Vega protein is good protein powder, make sure you use ~6 ice cubes to give it smoothie consistency together with ~4-6 ounces of vanilla soy milk. Throw cinnamon into your shake, use peanut butter. You can also use the fruits listed above. Throw spinach or kale into the shake.   Recipe ideas: [employer], [employer], Lung, and [employer], wholefoodsmarket.com  Limit added sugars When you follow a heart-healthy eating plan, you should limit the amount of calories you consume each day from added sugars. Because added sugars do not provide essential nutrients and are extra calories, limiting them can help you choose nutrient-rich foods and stay within your daily calorie limit. Some foods, such as fruit, contain natural sugars. Added sugars do not occur naturally in foods, but instead are used to sweeten foods and drinks. Some examples of added sugars include brown sugar, corn syrup, dextrose, fructose, glucose, high-fructose corn syrup, raw sugar, and sucrose. In the [place], sweetened drinks, snacks, and sweets are the major sources of added sugars. Sweetened drinks account for about half of all added sugars consumed. The following are examples of foods and drinks with added sugars. Sweetened drinks include soft drinks or sodas, fruit drinks, sweetened coffee and tea, energy drinks, alcoholic drinks, and favored waters.  Snacks and sweets include grain-based desserts such as cakes, pies, cookies, brownies, doughnuts; dairy desserts such as ice cream, frozen desserts, and pudding; candies;  sugars; jams; syrups; and sweet toppings. To help you reduce the amount of added sugars in your diet: Choose unsweetened or whole fruits for snacks or dessert.  Choose drinks without added sugar such as water, low-fat or fat-free milk, or 100 percent fruit or vegetable juice.  Limit intake of sweetened drinks, snacks and desserts by eating them less often and in smaller amounts.  If you drink alcohol, you should limit your intake. Men should have no more than two alcoholic drinks per day. Women should have no more than one alcoholic drink per day. One drink is: 12 ounces of regular beer (5 percent alcohol)  5 ounces of wine (12 percent alcohol)  1 ounces of 80-proof liquor (40 percent alcohol)   Thank you for coming in today. I hope you feel we met your needs.  Feel free to call PCP if you have any questions or further requests.  Please consider signing up for MyChart if you do not already have it, as this is a great way to communicate with me.  Best,  Whitney McVey, PA-C   IF you received an x-ray today, you will receive an invoice from [facility]. Please contact [facility] at [phone] with questions or concerns regarding your invoice.   IF you received labwork today, you will receive an invoice from [facility]. Please contact [facility] at [phone] with questions or concerns regarding your invoice.   Our billing staff will not be able  to assist you with questions regarding bills from these companies.  You will be contacted with the lab results as soon as they are available. The fastest way to get your results is to activate your My Chart account. Instructions are located on the last page of this paperwork. If you have not heard from [place] regarding the results in 2 weeks, please contact this office.

## 2017-03-26 NOTE — Progress Notes (Signed)
Ines Bloomer, MD  polyethylene glycol powder (GLYCOLAX/MIRALAX) powder Take 17 g by mouth 2 (two) times daily as needed for moderate constipation. Patient not taking: Reported on 08/26/2016 05/10/16   Horald Pollen, MD    No Known Allergies  No past surgical history on  file.  Social History   Tobacco Use  . Smoking status: Never Smoker  . Smokeless tobacco: Never Used  Substance Use Topics  . Alcohol use: No    Alcohol/week: 0.0 oz  . Drug use: No    Family History  Problem Relation Age of Onset  . Cancer Mother        Melonoma in the Brain    Medication list has been reviewed and updated.  Physical Examination:  Physical Exam  Constitutional: He is oriented to person, place, and time. He appears well-developed and well-nourished. No distress.  HENT:  Head: Normocephalic and atraumatic.  Right Ear: External ear normal.  Left Ear: External ear normal.  Nose: Nose normal.  Mouth/Throat: No oropharyngeal exudate.  Eyes: Conjunctivae and EOM are normal. Pupils are equal, round, and reactive to light.  Neck: Normal range of motion. No thyromegaly present.  Cardiovascular: Normal rate, regular rhythm, normal heart sounds and intact distal pulses.  No murmur heard. Pulmonary/Chest: Effort normal and breath sounds normal. No respiratory distress. He has no wheezes.  Abdominal: Soft. Bowel sounds are normal. He exhibits no distension and no mass. There is no tenderness.  Genitourinary: Penis normal. No penile tenderness.  Musculoskeletal: Normal range of motion. He exhibits no edema.  Lymphadenopathy:    He has no cervical adenopathy.  Neurological: He is alert and oriented to person, place, and time. He has normal reflexes.  Skin: Skin is warm and dry.  Psychiatric: He has a normal mood and affect. His behavior is normal. Judgment and thought content normal.  Vitals reviewed.   BP 110/76   Pulse 60   Temp 98.2 F (36.8 C) (Oral)   Resp 16   Ht 5' 10" (1.778 m)   Wt 166 lb 3.2 oz (75.4 kg)   SpO2 99%   BMI 23.85 kg/m   Assessment and Plan: 1. Annual physical exam - pt is here for annual exam c/o penis pain x 5 days and ice cravings. No known h/o anemia. His mother passed away 2 years ago from malignant melanoma - this still  greatly impacts his life adding to other stressors: he takes care of his little brother, has a child of his own and works a lot, and he has no motivation to exercise or eat healthy. No concerning findings on PE today. Plan to refer to dermatology for routine skin checks. Discussed safe sex practices, stress management and healthy lifestyls. Labs are pending. Will contact with results.  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  2. Family history of malignant melanoma - Ambulatory referral to Dermatology  3. Penis pain 4. Screen for STD (sexually transmitted disease) 5. Lower urinary tract symptoms (LUTS) - Chlamydia/Gonococcus/Trichomonas, NAA - RPR - HIV antibody - POCT Microscopic Urinalysis ([facility]) 6. Screening, lipid - Lipid panel  7. Screening for endocrine disorder - CMP14+EGFR - POCT urinalysis dipstick  8. Screening for deficiency anemia - CBC with Differential/Platelet - Iron, TIBC and Ferritin Panel  Mercer Pod, PA-C  [facility] 03/26/2017 10:06 AM

## 2017-03-27 LAB — IRON,TIBC AND FERRITIN PANEL
Ferritin: 108 ng/mL (ref 30–400)
Iron Saturation: 44 % (ref 15–55)
Iron: 143 ug/dL (ref 38–169)
Total Iron Binding Capacity: 328 ug/dL (ref 250–450)
UIBC: 185 ug/dL (ref 111–343)

## 2017-03-27 LAB — CBC WITH DIFFERENTIAL/PLATELET
Basophils Absolute: 0 10*3/uL (ref 0.0–0.2)
Basos: 1 %
EOS (ABSOLUTE): 0.1 10*3/uL (ref 0.0–0.4)
Eos: 3 %
Hematocrit: 46.4 % (ref 37.5–51.0)
Hemoglobin: 15.7 g/dL (ref 13.0–17.7)
Immature Grans (Abs): 0 10*3/uL (ref 0.0–0.1)
Immature Granulocytes: 0 %
Lymphocytes Absolute: 1.3 10*3/uL (ref 0.7–3.1)
Lymphs: 38 %
MCH: 29.3 pg (ref 26.6–33.0)
MCHC: 33.8 g/dL (ref 31.5–35.7)
MCV: 87 fL (ref 79–97)
Monocytes Absolute: 0.3 10*3/uL (ref 0.1–0.9)
Monocytes: 9 %
Neutrophils Absolute: 1.6 10*3/uL (ref 1.4–7.0)
Neutrophils: 49 %
Platelets: 256 10*3/uL (ref 150–379)
RBC: 5.36 x10E6/uL (ref 4.14–5.80)
RDW: 13.4 % (ref 12.3–15.4)
WBC: 3.3 10*3/uL — ABNORMAL LOW (ref 3.4–10.8)

## 2017-03-27 LAB — CMP14+EGFR
ALT: 18 IU/L (ref 0–44)
AST: 22 IU/L (ref 0–40)
Albumin/Globulin Ratio: 2.1 (ref 1.2–2.2)
Albumin: 5.2 g/dL (ref 3.5–5.5)
Alkaline Phosphatase: 54 IU/L (ref 39–117)
BUN/Creatinine Ratio: 10 (ref 9–20)
BUN: 12 mg/dL (ref 6–20)
Bilirubin Total: 0.9 mg/dL (ref 0.0–1.2)
CO2: 26 mmol/L (ref 20–29)
Calcium: 9.8 mg/dL (ref 8.7–10.2)
Chloride: 101 mmol/L (ref 96–106)
Creatinine, Ser: 1.19 mg/dL (ref 0.76–1.27)
GFR calc Af Amer: 97 mL/min/{1.73_m2} (ref >59)
GFR calc non Af Amer: 84 mL/min/{1.73_m2} (ref >59)
Globulin, Total: 2.5 g/dL (ref 1.5–4.5)
Glucose: 85 mg/dL (ref 65–99)
Potassium: 5 mmol/L (ref 3.5–5.2)
Sodium: 139 mmol/L (ref 134–144)
Total Protein: 7.7 g/dL (ref 6.0–8.5)

## 2017-03-27 LAB — LIPID PANEL
Chol/HDL Ratio: 2.9 ratio (ref 0.0–5.0)
Cholesterol, Total: 223 mg/dL — ABNORMAL HIGH (ref 100–199)
HDL: 78 mg/dL (ref >39)
LDL Calculated: 132 mg/dL — ABNORMAL HIGH (ref 0–99)
Triglycerides: 64 mg/dL (ref 0–149)
VLDL Cholesterol Cal: 13 mg/dL (ref 5–40)

## 2017-03-27 LAB — HIV ANTIBODY (ROUTINE TESTING W REFLEX): HIV Screen 4th Generation wRfx: NONREACTIVE

## 2017-03-27 LAB — RPR: RPR Ser Ql: NONREACTIVE

## 2017-03-29 LAB — CHLAMYDIA/GONOCOCCUS/TRICHOMONAS, NAA
Chlamydia by NAA: NEGATIVE
Gonococcus by NAA: NEGATIVE
Trich vag by NAA: NEGATIVE

## 2017-03-30 ENCOUNTER — Encounter: Payer: Self-pay | Admitting: Physician Assistant

## 2017-03-30 NOTE — Progress Notes (Signed)
Please call pt and let him know he was negative for gonorrhea, chlamydia, trichomonas, HIV, syphilis.  A result note with all of his lab work was sent in the mail.  He can refer to this for all of the results, which were normal.  Thank you!

## 2017-05-27 ENCOUNTER — Encounter: Payer: Self-pay | Admitting: Family Medicine

## 2017-06-01 ENCOUNTER — Encounter: Payer: Self-pay | Admitting: Family Medicine

## 2018-11-30 ENCOUNTER — Telehealth: Payer: Self-pay | Admitting: Emergency Medicine

## 2018-11-30 ENCOUNTER — Ambulatory Visit (INDEPENDENT_AMBULATORY_CARE_PROVIDER_SITE_OTHER): Payer: Medicaid Other | Admitting: Emergency Medicine

## 2018-11-30 ENCOUNTER — Other Ambulatory Visit: Payer: Self-pay

## 2018-11-30 ENCOUNTER — Encounter: Payer: Self-pay | Admitting: Emergency Medicine

## 2018-11-30 VITALS — BP 135/79 | HR 76 | Temp 98.5°F | Resp 16 | Ht 70.0 in | Wt 163.2 lb

## 2018-11-30 DIAGNOSIS — Z130 Encounter for screening for diseases of the blood and blood-forming organs and certain disorders involving the immune mechanism: Secondary | ICD-10-CM

## 2018-11-30 DIAGNOSIS — Z0000 Encounter for general adult medical examination without abnormal findings: Secondary | ICD-10-CM | POA: Diagnosis not present

## 2018-11-30 DIAGNOSIS — Z23 Encounter for immunization: Secondary | ICD-10-CM

## 2018-11-30 DIAGNOSIS — Z13220 Encounter for screening for lipoid disorders: Secondary | ICD-10-CM

## 2018-11-30 NOTE — Telephone Encounter (Signed)
11/30/2018 - PATIENT HAD HIS COMPLETE PHYSICAL WITH DR. Kittie Plater ON Thursday (11/30/2018). Mason Quinn HIS NEXT ANNUAL PHYSICAL FOR Tuesday (12/04/2019). I TRIED TO CALL Mason Quinn TO BE SURE I PUT 9:20am ON HIS APPOINTMENT CARD AND NOT 8:20am. HIS VOICE MAIL WAS NOT TAKING MESSAGES. IF HE HAPPENS TO CALL BACK I JUST WANT TO BE SURE I PUT 9:20am ON HIS CARD. [place]

## 2018-11-30 NOTE — Progress Notes (Signed)
Mason Quinn 28 y.o.   Chief Complaint  Patient presents with  . Annual Exam    HISTORY OF PRESENT ILLNESS: This is a 28 y.o. male here for his annual exam. Healthy male with a healthy lifestyle. States he "cannot gain weight". Has a history of chronic right shoulder dislocations.  Advised to get surgery by orthopedics. Concerned about HIV. Otherwise doing well with no other complaints or medical concerns today.  HPI   Prior to Admission medications   Medication Sig Start Date End Date Taking? Authorizing Provider  OVER THE COUNTER MEDICATION daily.    [provider]    No Known Allergies  Patient Active Problem List   Diagnosis Date Noted  . Lower urinary tract symptoms (LUTS) 08/26/2016    History reviewed. No pertinent past medical history.  History reviewed. No pertinent surgical history.  Social History   Socioeconomic History  . Marital status: Single    Spouse name: n/a  . Number of children: 1  . Years of education: college  . Highest education level: Not on file  Occupational History  . Occupation: Database administrator    Comment: makes Scientist, research (life sciences)  Social Needs  . Financial resource strain: Not on file  . Food insecurity    Worry: Not on file    Inability: Not on file  . Transportation needs    Medical: Not on file    Non-medical: Not on file  Tobacco Use  . Smoking status: Never Smoker  . Smokeless tobacco: Never Used  Substance and Sexual Activity  . Alcohol use: No    Alcohol/week: 0.0 standard drinks  . Drug use: No  . Sexual activity: Yes    Partners: Female  Lifestyle  . Physical activity    Days per week: Not on file    Minutes per session: Not on file  . Stress: Not on file  Relationships  . Social Herbalist on phone: Not on file    Gets together: Not on file    Attends religious service: Not on file    Active member of club or organization: Not on file    Attends meetings of clubs or organizations: Not on  file    Relationship status: Not on file  . Intimate partner violence    Fear of current or ex partner: Not on file    Emotionally abused: Not on file    Physically abused: Not on file    Forced sexual activity: Not on file  Other Topics Concern  . Not on file  Social History Narrative   Lives alone.   Family lives in [place], [place].    Family History  Problem Relation Age of Onset  . Cancer Mother        Melonoma in the Brain  . Hypertension Mother   . Cancer Paternal Grandmother   . Cancer Paternal Aunt      Review of Systems  Constitutional: Negative.  Negative for chills and fever.  HENT: Negative.  Negative for congestion and sore throat.   Eyes: Negative.   Respiratory: Negative.  Negative for cough and shortness of breath.   Cardiovascular: Negative.  Negative for chest pain and palpitations.  Gastrointestinal: Negative.  Negative for abdominal pain, diarrhea, nausea and vomiting.  Genitourinary: Negative.  Negative for hematuria.  Musculoskeletal: Negative.  Negative for myalgias.  Skin: Negative.  Negative for rash.  Neurological: Negative for dizziness and headaches.  All other systems reviewed and are negative.  Vitals:  11/30/18 0952  BP: 135/79  Pulse: 76  Resp: 16  Temp: 98.5 F (36.9 C)  SpO2: 98%     Physical Exam Vitals signs reviewed.  Constitutional:      Appearance: Normal appearance.  HENT:     Head: Normocephalic.     Mouth/Throat:     Mouth: Mucous membranes are moist.     Pharynx: Oropharynx is clear.  Eyes:     Extraocular Movements: Extraocular movements intact.     Conjunctiva/sclera: Conjunctivae normal.     Pupils: Pupils are equal, round, and reactive to light.  Neck:     Musculoskeletal: Normal range of motion and neck supple.  Cardiovascular:     Rate and Rhythm: Normal rate and regular rhythm.     Pulses: Normal pulses.     Heart sounds: Normal heart sounds.  Pulmonary:     Effort: Pulmonary effort is normal.      Breath sounds: Normal breath sounds.  Abdominal:     General: Bowel sounds are normal. There is no distension.     Palpations: Abdomen is soft. There is no mass.     Tenderness: There is no abdominal tenderness.  Musculoskeletal: Normal range of motion.     Right lower leg: No edema.     Left lower leg: No edema.  Skin:    General: Skin is warm and dry.     Capillary Refill: Capillary refill takes less than 2 seconds.  Neurological:     General: No focal deficit present.     Mental Status: He is alert and oriented to person, place, and time.  Psychiatric:        Mood and Affect: Mood normal.        Behavior: Behavior normal.      ASSESSMENT & PLAN: Mason Quinn was seen today for annual exam.  Diagnoses and all orders for this visit:  Routine general medical examination at a health care facility  Screening for deficiency anemia -     CBC with Differential  Screening for lipoid disorders -     Lipid panel  Screening for endocrine, metabolic and immunity disorder -     Comprehensive metabolic panel -     TSH -     HIV antibody -     Hemoglobin A1c  Other orders -     TDAP VACCINE    Patient Instructions       If you have lab work done today you will be contacted with your lab results within the next 2 weeks.  If you have not heard from [place] then please contact us. The fastest way to get your results is to register for My Chart.   IF you received an x-ray today, you will receive an invoice from [facility]. Please contact [facility] at [phone] with questions or concerns regarding your invoice.   IF you received labwork today, you will receive an invoice from [facility]. Please contact [facility] at [phone] with questions or concerns regarding your invoice.   Our billing staff will not be able to assist you with questions regarding bills from these companies.  You will be contacted with the lab results as soon as they are available. The  fastest way to get your results is to activate your My Chart account. Instructions are located on the last page of this paperwork. If you have not heard from [place] regarding the results in 2 weeks, please contact this office.      Health Maintenance, Male  Adopting a healthy lifestyle and getting preventive care are important in promoting health and wellness. Ask your health care provider about:  The right schedule for you to have regular tests and exams.  Things you can do on your own to prevent diseases and keep yourself healthy. What should I know about diet, weight, and exercise? Eat a healthy diet   Eat a diet that includes plenty of vegetables, fruits, low-fat dairy products, and lean protein.  Do not eat a lot of foods that are high in solid fats, added sugars, or sodium. Maintain a healthy weight Body mass index (BMI) is a measurement that can be used to identify possible weight problems. It estimates body fat based on height and weight. Your health care provider can help determine your BMI and help you achieve or maintain a healthy weight. Get regular exercise Get regular exercise. This is one of the most important things you can do for your health. Most adults should:  Exercise for at least 150 minutes each week. The exercise should increase your heart rate and make you sweat (moderate-intensity exercise).  Do strengthening exercises at least twice a week. This is in addition to the moderate-intensity exercise.  Spend less time sitting. Even light physical activity can be beneficial. Watch cholesterol and blood lipids Have your blood tested for lipids and cholesterol at 28 years of age, then have this test every 5 years. You may need to have your cholesterol levels checked more often if:  Your lipid or cholesterol levels are high.  You are older than 28 years of age.  You are at high risk for heart disease. What should I know about cancer screening? Many types of cancers  can be detected early and may often be prevented. Depending on your health history and family history, you may need to have cancer screening at various ages. This may include screening for:  Colorectal cancer.  Prostate cancer.  Skin cancer.  Lung cancer. What should I know about heart disease, diabetes, and high blood pressure? Blood pressure and heart disease  High blood pressure causes heart disease and increases the risk of stroke. This is more likely to develop in people who have high blood pressure readings, are of African descent, or are overweight.  Talk with your health care provider about your target blood pressure readings.  Have your blood pressure checked: ? Every 3-5 years if you are 62-74 years of age. ? Every year if you are 32 years old or older.  If you are between the ages of 10 and 78 and are a current or former smoker, ask your health care provider if you should have a one-time screening for abdominal aortic aneurysm (AAA). Diabetes Have regular diabetes screenings. This checks your fasting blood sugar level. Have the screening done:  Once every three years after age 72 if you are at a normal weight and have a low risk for diabetes.  More often and at a younger age if you are overweight or have a high risk for diabetes. What should I know about preventing infection? Hepatitis B If you have a higher risk for hepatitis B, you should be screened for this virus. Talk with your health care provider to find out if you are at risk for hepatitis B infection. Hepatitis C Blood testing is recommended for:  Everyone born from [birth year] through 1965.  Anyone with known risk factors for hepatitis C. Sexually transmitted infections (STIs)  You should be screened each year for STIs, including  gonorrhea and chlamydia, if: ? You are sexually active and are younger than 28 years of age. ? You are older than 28 years of age and your health care provider tells you that you are at  risk for this type of infection. ? Your sexual activity has changed since you were last screened, and you are at increased risk for chlamydia or gonorrhea. Ask your health care provider if you are at risk.  Ask your health care provider about whether you are at high risk for HIV. Your health care provider may recommend a prescription medicine to help prevent HIV infection. If you choose to take medicine to prevent HIV, you should first get tested for HIV. You should then be tested every 3 months for as long as you are taking the medicine. Follow these instructions at home: Lifestyle  Do not use any products that contain nicotine or tobacco, such as cigarettes, e-cigarettes, and chewing tobacco. If you need help quitting, ask your health care provider.  Do not use street drugs.  Do not share needles.  Ask your health care provider for help if you need support or information about quitting drugs. Alcohol use  Do not drink alcohol if your health care provider tells you not to drink.  If you drink alcohol: ? Limit how much you have to 0-2 drinks a day. ? Be aware of how much alcohol is in your drink. In the [place] one drink equals one 12 oz bottle of beer (355 mL), one 5 oz glass of wine (148 mL), or one 1 oz glass of hard liquor (44 mL). General instructions  Schedule regular health, dental, and eye exams.  Stay current with your vaccines.  Tell your health care provider if: ? You often feel depressed. ? You have ever been abused or do not feel safe at home. Summary  Adopting a healthy lifestyle and getting preventive care are important in promoting health and wellness.  Follow your health care provider's instructions about healthy diet, exercising, and getting tested or screened for diseases.  Follow your health care provider's instructions on monitoring your cholesterol and blood pressure. This information is not intended to replace advice given to you by your health care  provider. Make sure you discuss any questions you have with your health care provider. Document Released: 06/26/2007 Document Revised: 12/21/2017 Document Reviewed: 12/21/2017 Elsevier Patient Education  2020 Elsevier Inc.      Agustina Caroli, MD [facility]

## 2018-11-30 NOTE — Patient Instructions (Addendum)
   If you have lab work done today you will be contacted with your lab results within the next 2 weeks.  If you have not heard from us then please contact us. The fastest way to get your results is to register for My Chart.   IF you received an x-ray today, you will receive an invoice from [facility]. Please contact [facility] at [phone] with questions or concerns regarding your invoice.   IF you received labwork today, you will receive an invoice from [facility]. Please contact [facility] at [phone] with questions or concerns regarding your invoice.   Our billing staff will not be able to assist you with questions regarding bills from these companies.  You will be contacted with the lab results as soon as they are available. The fastest way to get your results is to activate your My Chart account. Instructions are located on the last page of this paperwork. If you have not heard from us regarding the results in 2 weeks, please contact this office.     Health Maintenance, Male Adopting a healthy lifestyle and getting preventive care are important in promoting health and wellness. Ask your health care provider about:  The right schedule for you to have regular tests and exams.  Things you can do on your own to prevent diseases and keep yourself healthy. What should I know about diet, weight, and exercise? Eat a healthy diet   Eat a diet that includes plenty of vegetables, fruits, low-fat dairy products, and lean protein.  Do not eat a lot of foods that are high in solid fats, added sugars, or sodium. Maintain a healthy weight Body mass index (BMI) is a measurement that can be used to identify possible weight problems. It estimates body fat based on height and weight. Your health care provider can help determine your BMI and help you achieve or maintain a healthy weight. Get regular exercise Get regular exercise. This is one of the most important things you  can do for your health. Most adults should:  Exercise for at least 150 minutes each week. The exercise should increase your heart rate and make you sweat (moderate-intensity exercise).  Do strengthening exercises at least twice a week. This is in addition to the moderate-intensity exercise.  Spend less time sitting. Even light physical activity can be beneficial. Watch cholesterol and blood lipids Have your blood tested for lipids and cholesterol at 28 years of age, then have this test every 5 years. You may need to have your cholesterol levels checked more often if:  Your lipid or cholesterol levels are high.  You are older than 28 years of age.  You are at high risk for heart disease. What should I know about cancer screening? Many types of cancers can be detected early and may often be prevented. Depending on your health history and family history, you may need to have cancer screening at various ages. This may include screening for:  Colorectal cancer.  Prostate cancer.  Skin cancer.  Lung cancer. What should I know about heart disease, diabetes, and high blood pressure? Blood pressure and heart disease  High blood pressure causes heart disease and increases the risk of stroke. This is more likely to develop in people who have high blood pressure readings, are of African descent, or are overweight.  Talk with your health care provider about your target blood pressure readings.  Have your blood pressure checked: ? Every 3-5 years if you are 18-39 years   of age. ? Every year if you are 40 years old or older.  If you are between the ages of 65 and 75 and are a current or former smoker, ask your health care provider if you should have a one-time screening for abdominal aortic aneurysm (AAA). Diabetes Have regular diabetes screenings. This checks your fasting blood sugar level. Have the screening done:  Once every three years after age 45 if you are at a normal weight and have  a low risk for diabetes.  More often and at a younger age if you are overweight or have a high risk for diabetes. What should I know about preventing infection? Hepatitis B If you have a higher risk for hepatitis B, you should be screened for this virus. Talk with your health care provider to find out if you are at risk for hepatitis B infection. Hepatitis C Blood testing is recommended for:  Everyone born from 1945 through 1965.  Anyone with known risk factors for hepatitis C. Sexually transmitted infections (STIs)  You should be screened each year for STIs, including gonorrhea and chlamydia, if: ? You are sexually active and are younger than 28 years of age. ? You are older than 28 years of age and your health care provider tells you that you are at risk for this type of infection. ? Your sexual activity has changed since you were last screened, and you are at increased risk for chlamydia or gonorrhea. Ask your health care provider if you are at risk.  Ask your health care provider about whether you are at high risk for HIV. Your health care provider may recommend a prescription medicine to help prevent HIV infection. If you choose to take medicine to prevent HIV, you should first get tested for HIV. You should then be tested every 3 months for as long as you are taking the medicine. Follow these instructions at home: Lifestyle  Do not use any products that contain nicotine or tobacco, such as cigarettes, e-cigarettes, and chewing tobacco. If you need help quitting, ask your health care provider.  Do not use street drugs.  Do not share needles.  Ask your health care provider for help if you need support or information about quitting drugs. Alcohol use  Do not drink alcohol if your health care provider tells you not to drink.  If you drink alcohol: ? Limit how much you have to 0-2 drinks a day. ? Be aware of how much alcohol is in your drink. In the [place] one drink equals one 12  oz bottle of beer (355 mL), one 5 oz glass of wine (148 mL), or one 1 oz glass of hard liquor (44 mL). General instructions  Schedule regular health, dental, and eye exams.  Stay current with your vaccines.  Tell your health care provider if: ? You often feel depressed. ? You have ever been abused or do not feel safe at home. Summary  Adopting a healthy lifestyle and getting preventive care are important in promoting health and wellness.  Follow your health care provider's instructions about healthy diet, exercising, and getting tested or screened for diseases.  Follow your health care provider's instructions on monitoring your cholesterol and blood pressure. This information is not intended to replace advice given to you by your health care provider. Make sure you discuss any questions you have with your health care provider. Document Released: 06/26/2007 Document Revised: 12/21/2017 Document Reviewed: 12/21/2017 Elsevier Patient Education  2020 [employer] Inc.  

## 2018-12-01 ENCOUNTER — Encounter: Payer: Self-pay | Admitting: Emergency Medicine

## 2018-12-01 LAB — COMPREHENSIVE METABOLIC PANEL
ALT: 12 IU/L (ref 0–44)
AST: 26 IU/L (ref 0–40)
Albumin/Globulin Ratio: 2.2 (ref 1.2–2.2)
Albumin: 4.9 g/dL (ref 4.1–5.2)
Alkaline Phosphatase: 56 IU/L (ref 39–117)
BUN/Creatinine Ratio: 10 (ref 9–20)
BUN: 12 mg/dL (ref 6–20)
Bilirubin Total: 0.6 mg/dL (ref 0.0–1.2)
CO2: 23 mmol/L (ref 20–29)
Calcium: 9.7 mg/dL (ref 8.7–10.2)
Chloride: 103 mmol/L (ref 96–106)
Creatinine, Ser: 1.2 mg/dL (ref 0.76–1.27)
GFR calc Af Amer: 95 mL/min/{1.73_m2} (ref >59)
GFR calc non Af Amer: 82 mL/min/{1.73_m2} (ref >59)
Globulin, Total: 2.2 g/dL (ref 1.5–4.5)
Glucose: 90 mg/dL (ref 65–99)
Potassium: 4.8 mmol/L (ref 3.5–5.2)
Sodium: 140 mmol/L (ref 134–144)
Total Protein: 7.1 g/dL (ref 6.0–8.5)

## 2018-12-01 LAB — HEMOGLOBIN A1C
Est. average glucose Bld gHb Est-mCnc: 97 mg/dL
Hgb A1c MFr Bld: 5 % (ref 4.8–5.6)

## 2018-12-01 LAB — CBC WITH DIFFERENTIAL/PLATELET
Basophils Absolute: 0.1 10*3/uL (ref 0.0–0.2)
Basos: 2 %
EOS (ABSOLUTE): 0.1 10*3/uL (ref 0.0–0.4)
Eos: 2 %
Hematocrit: 43.4 % (ref 37.5–51.0)
Hemoglobin: 15.1 g/dL (ref 13.0–17.7)
Immature Grans (Abs): 0 10*3/uL (ref 0.0–0.1)
Immature Granulocytes: 0 %
Lymphocytes Absolute: 1.1 10*3/uL (ref 0.7–3.1)
Lymphs: 35 %
MCH: 30 pg (ref 26.6–33.0)
MCHC: 34.8 g/dL (ref 31.5–35.7)
MCV: 86 fL (ref 79–97)
Monocytes Absolute: 0.3 10*3/uL (ref 0.1–0.9)
Monocytes: 9 %
Neutrophils Absolute: 1.7 10*3/uL (ref 1.4–7.0)
Neutrophils: 52 %
Platelets: 264 10*3/uL (ref 150–450)
RBC: 5.04 x10E6/uL (ref 4.14–5.80)
RDW: 13 % (ref 11.6–15.4)
WBC: 3.2 10*3/uL — ABNORMAL LOW (ref 3.4–10.8)

## 2018-12-01 LAB — LIPID PANEL
Chol/HDL Ratio: 2.7 ratio (ref 0.0–5.0)
Cholesterol, Total: 197 mg/dL (ref 100–199)
HDL: 73 mg/dL (ref >39)
LDL Chol Calc (NIH): 117 mg/dL — ABNORMAL HIGH (ref 0–99)
Triglycerides: 37 mg/dL (ref 0–149)
VLDL Cholesterol Cal: 7 mg/dL (ref 5–40)

## 2018-12-01 LAB — HIV ANTIBODY (ROUTINE TESTING W REFLEX): HIV Screen 4th Generation wRfx: NONREACTIVE

## 2018-12-01 LAB — TSH: TSH: 0.92 u[IU]/mL (ref 0.450–4.500)

## 2019-01-08 ENCOUNTER — Encounter: Payer: Self-pay | Admitting: Emergency Medicine

## 2019-02-15 ENCOUNTER — Ambulatory Visit: Payer: Medicaid Other | Attending: Internal Medicine

## 2019-02-15 DIAGNOSIS — Z20822 Contact with and (suspected) exposure to covid-19: Secondary | ICD-10-CM

## 2019-02-16 ENCOUNTER — Other Ambulatory Visit: Payer: Medicaid Other

## 2019-02-16 LAB — NOVEL CORONAVIRUS, NAA: SARS-CoV-2, NAA: NOT DETECTED

## 2019-02-19 ENCOUNTER — Other Ambulatory Visit: Payer: Medicaid Other

## 2019-03-15 ENCOUNTER — Other Ambulatory Visit (HOSPITAL_COMMUNITY)
Admission: RE | Admit: 2019-03-15 | Discharge: 2019-03-15 | Disposition: A | Discharge status: Home / Self Care | Payer: Medicaid Other | Source: Ambulatory Visit | Attending: Emergency Medicine | Admitting: Emergency Medicine

## 2019-03-15 ENCOUNTER — Ambulatory Visit (INDEPENDENT_AMBULATORY_CARE_PROVIDER_SITE_OTHER): Payer: Self-pay | Admitting: Emergency Medicine

## 2019-03-15 ENCOUNTER — Other Ambulatory Visit: Payer: Self-pay

## 2019-03-15 ENCOUNTER — Encounter: Payer: Self-pay | Admitting: Emergency Medicine

## 2019-03-15 VITALS — BP 128/82 | HR 66 | Temp 98.3°F | Resp 16 | Ht 70.0 in | Wt 158.0 lb

## 2019-03-15 DIAGNOSIS — Z113 Encounter for screening for infections with a predominantly sexual mode of transmission: Secondary | ICD-10-CM

## 2019-03-15 DIAGNOSIS — R4582 Worries: Secondary | ICD-10-CM

## 2019-03-15 NOTE — Patient Instructions (Addendum)
   If you have lab work done today you will be contacted with your lab results within the next 2 weeks.  If you have not heard from us then please contact us. The fastest way to get your results is to register for My Chart.   IF you received an x-ray today, you will receive an invoice from [facility]. Please contact [facility] at [phone] with questions or concerns regarding your invoice.   IF you received labwork today, you will receive an invoice from [facility]. Please contact [facility] at [phone] with questions or concerns regarding your invoice.   Our billing staff will not be able to assist you with questions regarding bills from these companies.  You will be contacted with the lab results as soon as they are available. The fastest way to get your results is to activate your My Chart account. Instructions are located on the last page of this paperwork. If you have not heard from us regarding the results in 2 weeks, please contact this office.      Preventing Sexually Transmitted Infections, Adult Sexually transmitted infections (STIs) are diseases that are passed (transmitted) from person to person through bodily fluids exchanged during sex or sexual contact. Bodily fluids include saliva, semen, blood, vaginal mucus, and urine. You may have an increased risk for developing an STI if you have unprotected oral, vaginal, or anal sex. Some common STIs include:  Herpes.  Hepatitis B.  Chlamydia.  Gonorrhea.  Syphilis.  HPV (human papillomavirus).  HIV (human immunodeficiency virus), the virus that can cause AIDS (acquired immunodeficiency syndrome). How can I protect myself from sexually transmitted infections? The only way to completely prevent STIs is not to have sex of any kind (practice abstinence). This includes oral, vaginal, or anal sex. If you are sexually active, take these actions to lower your risk of getting an STI:  Have only one sex  partner (be monogamous) or limit the number of sexual partners you have.  Stay up-to-date on immunizations. Certain vaccines can lower your risk of getting certain STIs, such as: ? Hepatitis A and B vaccines. You may have been vaccinated as a young child, but likely need a booster shot as a teen or young adult. ? HPV vaccine.  Use methods that prevent the exchange of body fluids between partners (barrier protection) every time you have sex. Barrier protection can be used during oral, vaginal, or anal sex. Commonly used barrier methods include: ? Male condom. ? Male condom. ? Dental dam.  Get tested regularly for STIs. Have your sexual partner get tested regularly as well.  Avoid mixing alcohol, drugs, and sex. Alcohol and drug use can affect your ability to make good decisions and can lead to risky sexual behaviors.  Ask your health care provider about taking pre-exposure prophylaxis (PrEP) to prevent HIV infection if you: ? Have a HIV-positive sexual partner. ? Have multiple sexual partners or partners who do not know their HIV status, and do not regularly use a condom during sex. ? Use injection drugs and share needles. Birth control pills, injections, implants, and intrauterine devices (IUDs) do not protect against STIs. To prevent both STIs and pregnancy, always use a condom with another form of birth control. Some STIs, such as herpes, are spread through skin to skin contact. A condom does not protect you from getting such STIs. If you or your partner have herpes and there is an active flare with open sores, avoid all sexual contact. Why are these   changes important? Taking steps to practice safe sex protects you and others. Many STIs can be cured. However, some STIs are not curable and will affect you for the rest of your life. STIs can be passed on to another person even if you do not have symptoms. What can happen if changes are not made? Certain STIs may:  Require you to take  medicine for the rest of your life.  Affect your ability to have children (your fertility).  Increase your risk for developing another STI or certain serious health conditions, such as: ? Cervical cancer. ? Head and neck cancer. ? Pelvic inflammatory disease (PID) in women. ? Organ damage or damage to other parts of your body, if the infection spreads.  Be passed to a baby during childbirth. How are sexually transmitted infections treated? If you or your partner know or think that you may have an STI:  Talk with your health care provider about what can be done to treat it. Some STIs can be treated and cured with medicines.  For curable STIs, you and your partner should avoid sex during treatment and for several days after treatment is complete.  You and your partner should both be treated at the same time, if there is any chance that your partner is infected as well. If you get treatment but your partner does not, your partner can re-infect you when you resume sexual contact.  Do not have unprotected sex. Where to find more information Learn more about sexually transmitted diseases and infections from:  Centers for Disease Control and Prevention: ? More information about specific STIs: www.cdc.gov/std ? Find places to get sexual health counseling and treatment for free or for a low cost: gettested.cdc.gov  U.S. Department of Health and Human Services: www.womenshealth.gov/publications/our-publications/fact-sheet/sexually-transmitted-infections.html Summary  The only way to completely prevent STIs is not to have sex (practice abstinence), including oral, vaginal, or anal sex.  STIs can spread through saliva, semen, blood, vaginal mucus, urine, or sexual contact.  If you do have sex, limit your number of sexual partners and use a barrier protection method every time you have sex.  If you develop an STI, get treated right away and ask your partner to be treated as well. Do not  resume having sex until both of you have completed treatment for the STI. This information is not intended to replace advice given to you by your health care provider. Make sure you discuss any questions you have with your health care provider. Document Revised: 05/23/2018 Document Reviewed: 12/25/2015 Elsevier Patient Education  2020 Elsevier Inc.  

## 2019-03-15 NOTE — Progress Notes (Signed)
Mason Quinn 29 y.o.   Chief Complaint  Patient presents with  . STD check    per pt everything checked    HISTORY OF PRESENT ILLNESS: This is a 29 y.o. male sexually active, wants to be checked for STDs.  Asymptomatic but worried.  HPI   Prior to Admission medications   Medication Sig Start Date End Date Taking? Authorizing Provider  OVER THE COUNTER MEDICATION daily.    [provider]    No Known Allergies  Patient Active Problem List   Diagnosis Date Noted  . Lower urinary tract symptoms (LUTS) 08/26/2016    History reviewed. No pertinent past medical history.  History reviewed. No pertinent surgical history.  Social History   Socioeconomic History  . Marital status: Single    Spouse name: n/a  . Number of children: 1  . Years of education: college  . Highest education level: Not on file  Occupational History  . Occupation: Database administrator    Comment: makes paint  Tobacco Use  . Smoking status: Never Smoker  . Smokeless tobacco: Never Used  Substance and Sexual Activity  . Alcohol use: No    Alcohol/week: 0.0 standard drinks  . Drug use: No  . Sexual activity: Yes    Partners: Female  Other Topics Concern  . Not on file  Social History Narrative   Lives alone.   Family lives in [place], [place].   Social Determinants of Health   Financial Resource Strain:   . Difficulty of Paying Living Expenses: Not on file  Food Insecurity:   . Worried About Charity fundraiser in the Last Year: Not on file  . Ran Out of Food in the Last Year: Not on file  Transportation Needs:   . Lack of Transportation (Medical): Not on file  . Lack of Transportation (Non-Medical): Not on file  Physical Activity:   . Days of Exercise per Week: Not on file  . Minutes of Exercise per Session: Not on file  Stress:   . Feeling of Stress : Not on file  Social Connections:   . Frequency of Communication with Friends and Family: Not on file  . Frequency of  Social Gatherings with Friends and Family: Not on file  . Attends Religious Services: Not on file  . Active Member of Clubs or Organizations: Not on file  . Attends Archivist Meetings: Not on file  . Marital Status: Not on file  Intimate Partner Violence:   . Fear of Current or Ex-Partner: Not on file  . Emotionally Abused: Not on file  . Physically Abused: Not on file  . Sexually Abused: Not on file    Family History  Problem Relation Age of Onset  . Cancer Mother        Melonoma in the Brain  . Hypertension Mother   . Cancer Paternal Grandmother   . Cancer Paternal Aunt      Review of Systems  Constitutional: Negative.  Negative for chills and fever.  HENT: Negative.  Negative for congestion and sore throat.   Respiratory: Negative.  Negative for cough and shortness of breath.   Cardiovascular: Negative.  Negative for chest pain and palpitations.  Gastrointestinal: Negative.  Negative for abdominal pain, diarrhea, nausea and vomiting.  Genitourinary: Negative.  Negative for dysuria, flank pain and hematuria.  Musculoskeletal: Negative.  Negative for myalgias.  Skin: Negative.  Negative for rash.  Neurological: Negative.  Negative for dizziness and headaches.  All other systems  reviewed and are negative.  Today's Vitals   03/15/19 0924  BP: 128/82  Pulse: 66  Resp: 16  Temp: 98.3 F (36.8 C)  TempSrc: Temporal  SpO2: 98%  Weight: 158 lb (71.7 kg)  Height: 5\' 10"  (1.778 m)   Body mass index is 22.67 kg/m.   Physical Exam Vitals reviewed.  Constitutional:      Appearance: Normal appearance.  HENT:     Head: Normocephalic.  Eyes:     Extraocular Movements: Extraocular movements intact.     Pupils: Pupils are equal, round, and reactive to light.  Cardiovascular:     Rate and Rhythm: Normal rate and regular rhythm.  Pulmonary:     Effort: Pulmonary effort is normal.  Musculoskeletal:        General: Normal range of motion.     Cervical back:  Normal range of motion and neck supple.  Skin:    General: Skin is warm and dry.  Neurological:     General: No focal deficit present.     Mental Status: He is alert and oriented to person, place, and time.  Psychiatric:        Mood and Affect: Mood normal.        Behavior: Behavior normal.      ASSESSMENT & PLAN: Mason Quinn was seen today for std check.  Diagnoses and all orders for this visit:  Worries  Screening examination for STD (sexually transmitted disease) -     STD Screen (6) -     GC/Chlamydia probe amp ()not at [facility]    Patient Instructions       If you have lab work done today you will be contacted with your lab results within the next 2 weeks.  If you have not heard from [place] then please contact us. The fastest way to get your results is to register for My Chart.   IF you received an x-ray today, you will receive an invoice from [facility]. Please contact [facility] at [phone] with questions or concerns regarding your invoice.   IF you received labwork today, you will receive an invoice from [facility]. Please contact [facility] at [phone] with questions or concerns regarding your invoice.   Our billing staff will not be able to assist you with questions regarding bills from these companies.  You will be contacted with the lab results as soon as they are available. The fastest way to get your results is to activate your My Chart account. Instructions are located on the last page of this paperwork. If you have not heard from [place] regarding the results in 2 weeks, please contact this office.     Preventing Sexually Transmitted Infections, Adult Sexually transmitted infections (STIs) are diseases that are passed (transmitted) from person to person through bodily fluids exchanged during sex or sexual contact. Bodily fluids include saliva, semen, blood, vaginal mucus, and urine. You may have an increased risk for developing an STI if  you have unprotected oral, vaginal, or anal sex. Some common STIs include:  Herpes.  Hepatitis B.  Chlamydia.  Gonorrhea.  Syphilis.  HPV (human papillomavirus).  HIV (human immunodeficiency virus), the virus that can cause AIDS (acquired immunodeficiency syndrome). How can I protect myself from sexually transmitted infections? The only way to completely prevent STIs is not to have sex of any kind (practice abstinence). This includes oral, vaginal, or anal sex. If you are sexually active, take these actions to lower your risk of getting an STI:  Have  only one sex partner (be monogamous) or limit the number of sexual partners you have.  Stay up-to-date on immunizations. Certain vaccines can lower your risk of getting certain STIs, such as: ? Hepatitis A and B vaccines. You may have been vaccinated as a young child, but likely need a booster shot as a teen or young adult. ? HPV vaccine.  Use methods that prevent the exchange of body fluids between partners (barrier protection) every time you have sex. Barrier protection can be used during oral, vaginal, or anal sex. Commonly used barrier methods include: ? Male condom. ? Male condom. ? Dental dam.  Get tested regularly for STIs. Have your sexual partner get tested regularly as well.  Avoid mixing alcohol, drugs, and sex. Alcohol and drug use can affect your ability to make good decisions and can lead to risky sexual behaviors.  Ask your health care provider about taking pre-exposure prophylaxis (PrEP) to prevent HIV infection if you: ? Have a HIV-positive sexual partner. ? Have multiple sexual partners or partners who do not know their HIV status, and do not regularly use a condom during sex. ? Use injection drugs and share needles. Birth control pills, injections, implants, and intrauterine devices (IUDs) do not protect against STIs. To prevent both STIs and pregnancy, always use a condom with another form of birth  control. Some STIs, such as herpes, are spread through skin to skin contact. A condom does not protect you from getting such STIs. If you or your partner have herpes and there is an active flare with open sores, avoid all sexual contact. Why are these changes important? Taking steps to practice safe sex protects you and others. Many STIs can be cured. However, some STIs are not curable and will affect you for the rest of your life. STIs can be passed on to another person even if you do not have symptoms. What can happen if changes are not made? Certain STIs may:  Require you to take medicine for the rest of your life.  Affect your ability to have children (your fertility).  Increase your risk for developing another STI or certain serious health conditions, such as: ? Cervical cancer. ? Head and neck cancer. ? Pelvic inflammatory disease (PID) in women. ? Organ damage or damage to other parts of your body, if the infection spreads.  Be passed to a baby during childbirth. How are sexually transmitted infections treated? If you or your partner know or think that you may have an STI:  Talk with your health care provider about what can be done to treat it. Some STIs can be treated and cured with medicines.  For curable STIs, you and your partner should avoid sex during treatment and for several days after treatment is complete.  You and your partner should both be treated at the same time, if there is any chance that your partner is infected as well. If you get treatment but your partner does not, your partner can re-infect you when you resume sexual contact.  Do not have unprotected sex. Where to find more information Learn more about sexually transmitted diseases and infections from:  Centers for Disease Control and Prevention: ? More information about specific STIs: AppraiserFraud.fi ? Find places to get sexual health counseling and treatment for free or for a low cost:  gettested.StoreMirror.com.cy  U.S. Department of Health and Human Services: http://white.info/.html Summary  The only way to completely prevent STIs is not to have sex (practice abstinence), including oral, vaginal, or  anal sex.  STIs can spread through saliva, semen, blood, vaginal mucus, urine, or sexual contact.  If you do have sex, limit your number of sexual partners and use a barrier protection method every time you have sex.  If you develop an STI, get treated right away and ask your partner to be treated as well. Do not resume having sex until both of you have completed treatment for the STI. This information is not intended to replace advice given to you by your health care provider. Make sure you discuss any questions you have with your health care provider. Document Revised: 05/23/2018 Document Reviewed: 12/25/2015 Elsevier Patient Education  2020 Elsevier Inc.      Agustina Caroli, MD [facility]

## 2019-03-16 ENCOUNTER — Encounter: Payer: Self-pay | Admitting: Emergency Medicine

## 2019-03-16 ENCOUNTER — Telehealth: Payer: Self-pay

## 2019-03-16 LAB — STD SCREEN (6)
HCV Ab: <0.1 s/co ratio (ref 0.0–0.9)
HIV Screen 4th Generation wRfx: NONREACTIVE
HSV 1 Glycoprotein G Ab, IgG: 27 index — ABNORMAL HIGH (ref 0.00–0.90)
HSV 2 IgG, Type Spec: <0.91 index (ref 0.00–0.90)
Hepatitis B Surface Ag: NEGATIVE
RPR Ser Ql: NONREACTIVE

## 2019-03-16 LAB — GC/CHLAMYDIA PROBE AMP (~~LOC~~) NOT AT ARMC
Chlamydia: NEGATIVE
Comment: NEGATIVE
Comment: NORMAL
Neisseria Gonorrhea: NEGATIVE

## 2019-03-16 LAB — HCV COMMENT:

## 2019-03-16 NOTE — Telephone Encounter (Signed)
LVM for pt to cb to go over lab results/questions that he may have.

## 2019-05-22 ENCOUNTER — Encounter: Payer: Self-pay | Admitting: Emergency Medicine

## 2019-05-23 ENCOUNTER — Other Ambulatory Visit: Payer: Self-pay

## 2019-05-23 ENCOUNTER — Encounter: Payer: Self-pay | Admitting: Emergency Medicine

## 2019-05-23 MED ORDER — VALACYCLOVIR HCL 500 MG PO TABS: 500.0000 mg | ORAL_TABLET | Freq: Every day | ORAL | 3 refills | Status: DC

## 2019-05-23 NOTE — Telephone Encounter (Signed)
Patient is wanting to know could you prescribe him Valacyclovir. He states he has never had an Outbreak just want to be on the safe side. Patient was on Valacyclovir 500mg  in 2016. Patient Pharmacy is [facility] on [address] [place] , [place] Please Advise.

## 2019-05-23 NOTE — Telephone Encounter (Signed)
Okay to prescribe.  Thanks

## 2019-05-23 NOTE — Telephone Encounter (Signed)
Informed patient @ 4:10pm

## 2019-07-04 ENCOUNTER — Ambulatory Visit (INDEPENDENT_AMBULATORY_CARE_PROVIDER_SITE_OTHER): Payer: Self-pay | Admitting: Emergency Medicine

## 2019-07-04 ENCOUNTER — Encounter: Payer: Self-pay | Admitting: Emergency Medicine

## 2019-07-04 ENCOUNTER — Other Ambulatory Visit: Payer: Self-pay

## 2019-07-04 ENCOUNTER — Other Ambulatory Visit (HOSPITAL_COMMUNITY)
Admission: RE | Admit: 2019-07-04 | Discharge: 2019-07-04 | Disposition: A | Discharge status: Home / Self Care | Payer: Medicaid Other | Source: Ambulatory Visit | Attending: Emergency Medicine | Admitting: Emergency Medicine

## 2019-07-04 VITALS — BP 135/80 | HR 87 | Temp 98.8°F | Ht 70.0 in | Wt 161.8 lb

## 2019-07-04 DIAGNOSIS — Z113 Encounter for screening for infections with a predominantly sexual mode of transmission: Secondary | ICD-10-CM | POA: Diagnosis present

## 2019-07-04 DIAGNOSIS — R4582 Worries: Secondary | ICD-10-CM

## 2019-07-04 NOTE — Progress Notes (Signed)
Mason Quinn 29 y.o.   Chief Complaint  Patient presents with  . STD testing    his 3 m check, no current sx    HISTORY OF PRESENT ILLNESS: This is a 29 y.o. male requesting STD screening.  Asymptomatic.  HPI   Prior to Admission medications   Medication Sig Start Date End Date Taking? Authorizing Provider  valACYclovir (VALTREX) 500 MG tablet Take 1 tablet (500 mg total) by mouth daily. Patient not taking: Reported on 07/04/2019 05/23/19   Mason Pollen, MD    No Known Allergies  Patient Active Problem List   Diagnosis Date Noted  . Lower urinary tract symptoms (LUTS) 08/26/2016    History reviewed. No pertinent past medical history.  History reviewed. No pertinent surgical history.  Social History   Socioeconomic History  . Marital status: Single    Spouse name: n/a  . Number of children: 1  . Years of education: college  . Highest education level: Not on file  Occupational History  . Occupation: Database administrator    Comment: makes paint  Tobacco Use  . Smoking status: Never Smoker  . Smokeless tobacco: Never Used  Substance and Sexual Activity  . Alcohol use: No    Alcohol/week: 0.0 standard drinks  . Drug use: No  . Sexual activity: Yes    Partners: Female  Other Topics Concern  . Not on file  Social History Narrative   Lives alone.   Family lives in [place], [place].   Social Determinants of Health   Financial Resource Strain:   . Difficulty of Paying Living Expenses:   Food Insecurity:   . Worried About Charity fundraiser in the Last Year:   . Arboriculturist in the Last Year:   Transportation Needs:   . Film/video editor (Medical):   Marland Kitchen Lack of Transportation (Non-Medical):   Physical Activity:   . Days of Exercise per Week:   . Minutes of Exercise per Session:   Stress:   . Feeling of Stress :   Social Connections:   . Frequency of Communication with Friends and Family:   . Frequency of Social Gatherings with Friends and  Family:   . Attends Religious Services:   . Active Member of Clubs or Organizations:   . Attends Archivist Meetings:   Marland Kitchen Marital Status:   Intimate Partner Violence:   . Fear of Current or Ex-Partner:   . Emotionally Abused:   Marland Kitchen Physically Abused:   . Sexually Abused:     Family History  Problem Relation Age of Onset  . Cancer Mother        Melonoma in the Brain  . Hypertension Mother   . Cancer Paternal Grandmother   . Cancer Paternal Aunt      Review of Systems  Constitutional: Negative.  Negative for chills and fever.  HENT: Negative.  Negative for congestion and sore throat.   Respiratory: Negative.  Negative for cough and shortness of breath.   Cardiovascular: Negative.  Negative for chest pain and palpitations.  Gastrointestinal: Negative.  Negative for abdominal pain, nausea and vomiting.  Genitourinary: Negative.  Negative for dysuria and hematuria.  Musculoskeletal: Negative.  Negative for back pain, myalgias and neck pain.  Skin: Negative.  Negative for rash.  Neurological: Negative.  Negative for dizziness and headaches.  All other systems reviewed and are negative.  Today's Vitals   07/04/19 1509  BP: 135/80  Pulse: 87  Temp: 98.8 F (37.1  C)  TempSrc: Temporal  SpO2: 95%  Weight: 161 lb 12.8 oz (73.4 kg)  Height: 5\' 10"  (1.778 m)   Body mass index is 23.22 kg/m.   Physical Exam Vitals reviewed.  Constitutional:      Appearance: Normal appearance.  HENT:     Head: Normocephalic.  Eyes:     Extraocular Movements: Extraocular movements intact.     Pupils: Pupils are equal, round, and reactive to light.  Cardiovascular:     Rate and Rhythm: Normal rate and regular rhythm.     Pulses: Normal pulses.     Heart sounds: Normal heart sounds.  Pulmonary:     Effort: Pulmonary effort is normal.     Breath sounds: Normal breath sounds.  Musculoskeletal:        General: Normal range of motion.     Cervical back: Normal range of motion and  neck supple.  Skin:    General: Skin is warm and dry.     Capillary Refill: Capillary refill takes less than 2 seconds.  Neurological:     General: No focal deficit present.     Mental Status: He is alert and oriented to person, place, and time.  Psychiatric:        Mood and Affect: Mood normal.        Behavior: Behavior normal.      ASSESSMENT & PLAN: Mason Quinn was seen today for std testing.  Diagnoses and all orders for this visit:  Worries  Screening examination for STD (sexually transmitted disease) -     GC/Chlamydia Probe Amp -     STD Panel    Patient Instructions       If you have lab work done today you will be contacted with your lab results within the next 2 weeks.  If you have not heard from [place] then please contact us. The fastest way to get your results is to register for My Chart.   IF you received an x-ray today, you will receive an invoice from [facility]. Please contact [facility] at [phone] with questions or concerns regarding your invoice.   IF you received labwork today, you will receive an invoice from [place]. Please contact [facility] at [phone] with questions or concerns regarding your invoice.   Our billing staff will not be able to assist you with questions regarding bills from these companies.  You will be contacted with the lab results as soon as they are available. The fastest way to get your results is to activate your My Chart account. Instructions are located on the last page of this paperwork. If you have not heard from [place] regarding the results in 2 weeks, please contact this office.      Preventing Sexually Transmitted Infections, Adult Sexually transmitted infections (STIs) are diseases that are passed (transmitted) from person to person through bodily fluids exchanged during sex or sexual contact. Bodily fluids include saliva, semen, blood, vaginal mucus, and urine. You may have an increased risk for  developing an STI if you have unprotected oral, vaginal, or anal sex. Some common STIs include:  Herpes.  Hepatitis B.  Chlamydia.  Gonorrhea.  Syphilis.  HPV (human papillomavirus).  HIV (human immunodeficiency virus), the virus that can cause AIDS (acquired immunodeficiency syndrome). How can I protect myself from sexually transmitted infections? The only way to completely prevent STIs is not to have sex of any kind (practice abstinence). This includes oral, vaginal, or anal sex. If you are sexually active, take these  actions to lower your risk of getting an STI:  Have only one sex partner (be monogamous) or limit the number of sexual partners you have.  Stay up-to-date on immunizations. Certain vaccines can lower your risk of getting certain STIs, such as: ? Hepatitis A and B vaccines. You may have been vaccinated as a young child, but likely need a booster shot as a teen or young adult. ? HPV vaccine.  Use methods that prevent the exchange of body fluids between partners (barrier protection) every time you have sex. Barrier protection can be used during oral, vaginal, or anal sex. Commonly used barrier methods include: ? Male condom. ? Male condom. ? Dental dam.  Get tested regularly for STIs. Have your sexual partner get tested regularly as well.  Avoid mixing alcohol, drugs, and sex. Alcohol and drug use can affect your ability to make good decisions and can lead to risky sexual behaviors.  Ask your health care provider about taking pre-exposure prophylaxis (PrEP) to prevent HIV infection if you: ? Have a HIV-positive sexual partner. ? Have multiple sexual partners or partners who do not know their HIV status, and do not regularly use a condom during sex. ? Use injection drugs and share needles. Birth control pills, injections, implants, and intrauterine devices (IUDs) do not protect against STIs. To prevent both STIs and pregnancy, always use a condom with another form  of birth control. Some STIs, such as herpes, are spread through skin to skin contact. A condom does not protect you from getting such STIs. If you or your partner have herpes and there is an active flare with open sores, avoid all sexual contact. Why are these changes important? Taking steps to practice safe sex protects you and others. Many STIs can be cured. However, some STIs are not curable and will affect you for the rest of your life. STIs can be passed on to another person even if you do not have symptoms. What can happen if changes are not made? Certain STIs may:  Require you to take medicine for the rest of your life.  Affect your ability to have children (your fertility).  Increase your risk for developing another STI or certain serious health conditions, such as: ? Cervical cancer. ? Head and neck cancer. ? Pelvic inflammatory disease (PID) in women. ? Organ damage or damage to other parts of your body, if the infection spreads.  Be passed to a baby during childbirth. How are sexually transmitted infections treated? If you or your partner know or think that you may have an STI:  Talk with your health care provider about what can be done to treat it. Some STIs can be treated and cured with medicines.  For curable STIs, you and your partner should avoid sex during treatment and for several days after treatment is complete.  You and your partner should both be treated at the same time, if there is any chance that your partner is infected as well. If you get treatment but your partner does not, your partner can re-infect you when you resume sexual contact.  Do not have unprotected sex. Where to find more information Learn more about sexually transmitted diseases and infections from:  Centers for Disease Control and Prevention: ? More information about specific STIs: AppraiserFraud.fi ? Find places to get sexual health counseling and treatment for free or for a low cost:  gettested.StoreMirror.com.cy  U.S. Department of Health and Human Services: http://white.info/.html Summary  The only way to completely prevent STIs  is not to have sex (practice abstinence), including oral, vaginal, or anal sex.  STIs can spread through saliva, semen, blood, vaginal mucus, urine, or sexual contact.  If you do have sex, limit your number of sexual partners and use a barrier protection method every time you have sex.  If you develop an STI, get treated right away and ask your partner to be treated as well. Do not resume having sex until both of you have completed treatment for the STI. This information is not intended to replace advice given to you by your health care provider. Make sure you discuss any questions you have with your health care provider. Document Revised: 05/23/2018 Document Reviewed: 12/25/2015 Elsevier Patient Education  2020 Elsevier Inc.       Agustina Caroli, MD [facility]

## 2019-07-04 NOTE — Addendum Note (Signed)
Addended by: Meredeth Ide on: 07/04/2019 04:00 PM   Modules accepted: Orders

## 2019-07-04 NOTE — Patient Instructions (Addendum)
If you have lab work done today you will be contacted with your lab results within the next 2 weeks.  If you have not heard from [place] then please contact us. The fastest way to get your results is to register for My Chart.   IF you received an x-ray today, you will receive an invoice from [facility]. Please contact [facility] at [phone] with questions or concerns regarding your invoice.   IF you received labwork today, you will receive an invoice from [facility]. Please contact [facility] at [phone] with questions or concerns regarding your invoice.   Our billing staff will not be able to assist you with questions regarding bills from these companies.  You will be contacted with the lab results as soon as they are available. The fastest way to get your results is to activate your My Chart account. Instructions are located on the last page of this paperwork. If you have not heard from [place] regarding the results in 2 weeks, please contact this office.      Preventing Sexually Transmitted Infections, Adult Sexually transmitted infections (STIs) are diseases that are passed (transmitted) from person to person through bodily fluids exchanged during sex or sexual contact. Bodily fluids include saliva, semen, blood, vaginal mucus, and urine. You may have an increased risk for developing an STI if you have unprotected oral, vaginal, or anal sex. Some common STIs include:  Herpes.  Hepatitis B.  Chlamydia.  Gonorrhea.  Syphilis.  HPV (human papillomavirus).  HIV (human immunodeficiency virus), the virus that can cause AIDS (acquired immunodeficiency syndrome). How can I protect myself from sexually transmitted infections? The only way to completely prevent STIs is not to have sex of any kind (practice abstinence). This includes oral, vaginal, or anal sex. If you are sexually active, take these actions to lower your risk of getting an STI:  Have only one sex  partner (be monogamous) or limit the number of sexual partners you have.  Stay up-to-date on immunizations. Certain vaccines can lower your risk of getting certain STIs, such as: ? Hepatitis A and B vaccines. You may have been vaccinated as a young child, but likely need a booster shot as a teen or young adult. ? HPV vaccine.  Use methods that prevent the exchange of body fluids between partners (barrier protection) every time you have sex. Barrier protection can be used during oral, vaginal, or anal sex. Commonly used barrier methods include: ? Male condom. ? Male condom. ? Dental dam.  Get tested regularly for STIs. Have your sexual partner get tested regularly as well.  Avoid mixing alcohol, drugs, and sex. Alcohol and drug use can affect your ability to make good decisions and can lead to risky sexual behaviors.  Ask your health care provider about taking pre-exposure prophylaxis (PrEP) to prevent HIV infection if you: ? Have a HIV-positive sexual partner. ? Have multiple sexual partners or partners who do not know their HIV status, and do not regularly use a condom during sex. ? Use injection drugs and share needles. Birth control pills, injections, implants, and intrauterine devices (IUDs) do not protect against STIs. To prevent both STIs and pregnancy, always use a condom with another form of birth control. Some STIs, such as herpes, are spread through skin to skin contact. A condom does not protect you from getting such STIs. If you or your partner have herpes and there is an active flare with open sores, avoid all sexual contact. Why are these  changes important? Taking steps to practice safe sex protects you and others. Many STIs can be cured. However, some STIs are not curable and will affect you for the rest of your life. STIs can be passed on to another person even if you do not have symptoms. What can happen if changes are not made? Certain STIs may:  Require you to take  medicine for the rest of your life.  Affect your ability to have children (your fertility).  Increase your risk for developing another STI or certain serious health conditions, such as: ? Cervical cancer. ? Head and neck cancer. ? Pelvic inflammatory disease (PID) in women. ? Organ damage or damage to other parts of your body, if the infection spreads.  Be passed to a baby during childbirth. How are sexually transmitted infections treated? If you or your partner know or think that you may have an STI:  Talk with your health care provider about what can be done to treat it. Some STIs can be treated and cured with medicines.  For curable STIs, you and your partner should avoid sex during treatment and for several days after treatment is complete.  You and your partner should both be treated at the same time, if there is any chance that your partner is infected as well. If you get treatment but your partner does not, your partner can re-infect you when you resume sexual contact.  Do not have unprotected sex. Where to find more information Learn more about sexually transmitted diseases and infections from:  Centers for Disease Control and Prevention: ? More information about specific STIs: AppraiserFraud.fi ? Find places to get sexual health counseling and treatment for free or for a low cost: gettested.StoreMirror.com.cy  U.S. Department of Health and Human Services: http://white.info/.html Summary  The only way to completely prevent STIs is not to have sex (practice abstinence), including oral, vaginal, or anal sex.  STIs can spread through saliva, semen, blood, vaginal mucus, urine, or sexual contact.  If you do have sex, limit your number of sexual partners and use a barrier protection method every time you have sex.  If you develop an STI, get treated right away and ask your partner to be treated as well. Do not  resume having sex until both of you have completed treatment for the STI. This information is not intended to replace advice given to you by your health care provider. Make sure you discuss any questions you have with your health care provider. Document Revised: 05/23/2018 Document Reviewed: 12/25/2015 Elsevier Patient Education  2020 [employer].

## 2019-07-05 LAB — RPR+HSVIGM+HBSAG+HSV2(IGG)+...
HIV Screen 4th Generation wRfx: NONREACTIVE
HSV 2 IgG, Type Spec: <0.91 index (ref 0.00–0.90)
HSVI/II Comb IgM: <0.91 Ratio (ref 0.00–0.90)
Hepatitis B Surface Ag: NEGATIVE
RPR Ser Ql: NONREACTIVE

## 2019-07-06 LAB — GC/CHLAMYDIA PROBE AMP (~~LOC~~) NOT AT ARMC
Chlamydia: NEGATIVE
Comment: NEGATIVE
Comment: NORMAL
Neisseria Gonorrhea: NEGATIVE

## 2019-10-02 ENCOUNTER — Ambulatory Visit (INDEPENDENT_AMBULATORY_CARE_PROVIDER_SITE_OTHER): Payer: Medicaid Other

## 2019-10-02 ENCOUNTER — Ambulatory Visit (INDEPENDENT_AMBULATORY_CARE_PROVIDER_SITE_OTHER): Payer: Self-pay | Admitting: Emergency Medicine

## 2019-10-02 ENCOUNTER — Other Ambulatory Visit (HOSPITAL_COMMUNITY)
Admission: RE | Admit: 2019-10-02 | Discharge: 2019-10-02 | Disposition: A | Discharge status: Home / Self Care | Payer: Medicaid Other | Source: Ambulatory Visit | Attending: Emergency Medicine | Admitting: Emergency Medicine

## 2019-10-02 ENCOUNTER — Encounter: Payer: Self-pay | Admitting: Emergency Medicine

## 2019-10-02 ENCOUNTER — Other Ambulatory Visit: Payer: Self-pay

## 2019-10-02 VITALS — BP 133/82 | HR 85 | Temp 98.1°F | Resp 16 | Ht 70.0 in | Wt 158.0 lb

## 2019-10-02 DIAGNOSIS — M25511 Pain in right shoulder: Secondary | ICD-10-CM

## 2019-10-02 DIAGNOSIS — G8929 Other chronic pain: Secondary | ICD-10-CM

## 2019-10-02 DIAGNOSIS — Z113 Encounter for screening for infections with a predominantly sexual mode of transmission: Secondary | ICD-10-CM | POA: Insufficient documentation

## 2019-10-02 MED ORDER — MELOXICAM 15 MG PO TABS: 15.0000 mg | ORAL_TABLET | Freq: Every day | ORAL | 0 refills | Status: AC

## 2019-10-02 NOTE — Patient Instructions (Addendum)
   If you have lab work done today you will be contacted with your lab results within the next 2 weeks.  If you have not heard from us then please contact us. The fastest way to get your results is to register for My Chart.   IF you received an x-ray today, you will receive an invoice from [facility]. Please contact [facility] at [phone] with questions or concerns regarding your invoice.   IF you received labwork today, you will receive an invoice from [facility]. Please contact [facility] at [phone] with questions or concerns regarding your invoice.   Our billing staff will not be able to assist you with questions regarding bills from these companies.  You will be contacted with the lab results as soon as they are available. The fastest way to get your results is to activate your My Chart account. Instructions are located on the last page of this paperwork. If you have not heard from us regarding the results in 2 weeks, please contact this office.     Shoulder Pain Many things can cause shoulder pain, including:  An injury.  Moving the shoulder in the same way again and again (overuse).  Joint pain (arthritis). Pain can come from:  Swelling and irritation (inflammation) of any part of the shoulder.  An injury to the shoulder joint.  An injury to: ? Tissues that connect muscle to bone (tendons). ? Tissues that connect bones to each other (ligaments). ? Bones. Follow these instructions at home: Watch for changes in your symptoms. Let your doctor know about them. Follow these instructions to help with your pain. If you have a sling:  Wear the sling as told by your doctor. Remove it only as told by your doctor.  Loosen the sling if your fingers: ? Tingle. ? Become numb. ? Turn cold and blue.  Keep the sling clean.  If the sling is not waterproof: ? Do not let it get wet. ? Take the sling off when you shower or bathe. Managing pain, stiffness,  and swelling   If told, put ice on the painful area: ? Put ice in a plastic bag. ? Place a towel between your skin and the bag. ? Leave the ice on for 20 minutes, 2-3 times a day. Stop putting ice on if it does not help with the pain.  Squeeze a soft ball or a foam pad as much as possible. This prevents swelling in the shoulder. It also helps to strengthen the arm. General instructions  Take over-the-counter and prescription medicines only as told by your doctor.  Keep all follow-up visits as told by your doctor. This is important. Contact a doctor if:  Your pain gets worse.  Medicine does not help your pain.  You have new pain in your arm, hand, or fingers. Get help right away if:  Your arm, hand, or fingers: ? Tingle. ? Are numb. ? Are swollen. ? Are painful. ? Turn white or blue. Summary  Shoulder pain can be caused by many things. These include injury, moving the shoulder in the same away again and again, and joint pain.  Watch for changes in your symptoms. Let your doctor know about them.  This condition may be treated with a sling, ice, and pain medicine.  Contact your doctor if the pain gets worse or you have new pain. Get help right away if your arm, hand, or fingers tingle or get numb, swollen, or painful.  Keep all follow-up visits   as told by your doctor. This is important. This information is not intended to replace advice given to you by your health care provider. Make sure you discuss any questions you have with your health care provider. Document Revised: 07/12/2017 Document Reviewed: 07/12/2017 Elsevier Patient Education  2020 Elsevier Inc.  

## 2019-10-02 NOTE — Progress Notes (Signed)
Mason Quinn 29 y.o.   Chief Complaint  Patient presents with  . Shoulder Pain    right started early this month    HISTORY OF PRESENT ILLNESS: This is a 29 y.o. male complaining of right shoulder pain for the past several weeks.  Denies new injuries. Also requesting STD screening test. No other complaints or medical concerns today.  HPI   Prior to Admission medications   Medication Sig Start Date End Date Taking? Authorizing Provider  valACYclovir (VALTREX) 500 MG tablet Take 1 tablet (500 mg total) by mouth daily. 05/23/19  Yes SagardiaInes Bloomer, MD    No Known Allergies  Patient Active Problem List   Diagnosis Date Noted  . Lower urinary tract symptoms (LUTS) 08/26/2016    History reviewed. No pertinent past medical history.  History reviewed. No pertinent surgical history.  Social History   Socioeconomic History  . Marital status: Single    Spouse name: n/a  . Number of children: 1  . Years of education: college  . Highest education level: Not on file  Occupational History  . Occupation: Database administrator    Comment: makes paint  Tobacco Use  . Smoking status: Never Smoker  . Smokeless tobacco: Never Used  Substance and Sexual Activity  . Alcohol use: No    Alcohol/week: 0.0 standard drinks  . Drug use: No  . Sexual activity: Yes    Partners: Female  Other Topics Concern  . Not on file  Social History Narrative   Lives alone.   Family lives in [place], [place].   Social Determinants of Health   Financial Resource Strain:   . Difficulty of Paying Living Expenses: Not on file  Food Insecurity:   . Worried About Charity fundraiser in the Last Year: Not on file  . Ran Out of Food in the Last Year: Not on file  Transportation Needs:   . Lack of Transportation (Medical): Not on file  . Lack of Transportation (Non-Medical): Not on file  Physical Activity:   . Days of Exercise per Week: Not on file  . Minutes of Exercise per Session: Not on  file  Stress:   . Feeling of Stress : Not on file  Social Connections:   . Frequency of Communication with Friends and Family: Not on file  . Frequency of Social Gatherings with Friends and Family: Not on file  . Attends Religious Services: Not on file  . Active Member of Clubs or Organizations: Not on file  . Attends Archivist Meetings: Not on file  . Marital Status: Not on file  Intimate Partner Violence:   . Fear of Current or Ex-Partner: Not on file  . Emotionally Abused: Not on file  . Physically Abused: Not on file  . Sexually Abused: Not on file    Family History  Problem Relation Age of Onset  . Cancer Mother        Melonoma in the Brain  . Hypertension Mother   . Cancer Paternal Grandmother   . Cancer Paternal Aunt      Review of Systems  Constitutional: Negative.  Negative for chills and fever.  HENT: Negative.  Negative for congestion and sore throat.   Respiratory: Negative.  Negative for cough and shortness of breath.   Cardiovascular: Negative.  Negative for chest pain and palpitations.  Gastrointestinal: Negative.  Negative for abdominal pain, diarrhea, nausea and vomiting.  Genitourinary: Negative.  Negative for dysuria and hematuria.  Musculoskeletal: Positive for  joint pain. Negative for myalgias.  Skin: Negative.  Negative for rash.  Neurological: Negative for dizziness and headaches.  All other systems reviewed and are negative.  Today's Vitals   10/02/19 1003  BP: 133/82  Pulse: 85  Resp: 16  Temp: 98.1 F (36.7 C)  TempSrc: Temporal  SpO2: 97%  Weight: 158 lb (71.7 kg)  Height: 5\' 10"  (1.778 m)   Body mass index is 22.67 kg/m.   Physical Exam Vitals reviewed.  Constitutional:      Appearance: Normal appearance.  HENT:     Head: Normocephalic.  Eyes:     Extraocular Movements: Extraocular movements intact.  Cardiovascular:     Rate and Rhythm: Normal rate.  Pulmonary:     Effort: Pulmonary effort is normal.    Musculoskeletal:     Cervical back: Normal range of motion.     Comments: Right shoulder: No significant tenderness but decreased range of motion.  Skin:    General: Skin is warm and dry.     Capillary Refill: Capillary refill takes less than 2 seconds.  Neurological:     General: No focal deficit present.     Mental Status: He is alert and oriented to person, place, and time.  Psychiatric:        Mood and Affect: Mood normal.        Behavior: Behavior normal.    DG Shoulder Right  Result Date: 10/02/2019 CLINICAL DATA:  Right shoulder pain, chronic EXAM: RIGHT SHOULDER - 2+ VIEW COMPARISON:  06/05/2013 FINDINGS: 14 mm well corticated bone density inferior to the humeral head compatible with intra-articular loose body. This has enlarged since prior study. Mild joint space narrowing in the glenohumeral joint. No acute bony abnormality. Specifically, no fracture, subluxation, or dislocation. IMPRESSION: Joint space narrowing in the glenohumeral joint. Intra-articular loose body. No acute bony abnormality. Electronically Signed   By: Rolm Baptise M.D.   On: 10/02/2019 10:43     ASSESSMENT & PLAN: Mason Quinn was seen today for shoulder pain.  Diagnoses and all orders for this visit:  Chronic right shoulder pain -     DG Shoulder Right -     meloxicam (MOBIC) 15 MG tablet; Take 1 tablet (15 mg total) by mouth daily for 7 days.  Screening examination for STD (sexually transmitted disease) -     STD Panel -     GC/Chlamydia probe amp ([place])not at [facility]    Patient Instructions       If you have lab work done today you will be contacted with your lab results within the next 2 weeks.  If you have not heard from [place] then please contact us. The fastest way to get your results is to register for My Chart.   IF you received an x-ray today, you will receive an invoice from [facility]. Please contact [facility] at [phone] with questions or concerns regarding  your invoice.   IF you received labwork today, you will receive an invoice from [facility]. Please contact [facility] at [phone] with questions or concerns regarding your invoice.   Our billing staff will not be able to assist you with questions regarding bills from these companies.  You will be contacted with the lab results as soon as they are available. The fastest way to get your results is to activate your My Chart account. Instructions are located on the last page of this paperwork. If you have not heard from [place] regarding the results in 2 weeks, please  contact this office.     Shoulder Pain Many things can cause shoulder pain, including:  An injury.  Moving the shoulder in the same way again and again (overuse).  Joint pain (arthritis). Pain can come from:  Swelling and irritation (inflammation) of any part of the shoulder.  An injury to the shoulder joint.  An injury to: ? Tissues that connect muscle to bone (tendons). ? Tissues that connect bones to each other (ligaments). ? Bones. Follow these instructions at home: Watch for changes in your symptoms. Let your doctor know about them. Follow these instructions to help with your pain. If you have a sling:  Wear the sling as told by your doctor. Remove it only as told by your doctor.  Loosen the sling if your fingers: ? Tingle. ? Become numb. ? Turn cold and blue.  Keep the sling clean.  If the sling is not waterproof: ? Do not let it get wet. ? Take the sling off when you shower or bathe. Managing pain, stiffness, and swelling   If told, put ice on the painful area: ? Put ice in a plastic bag. ? Place a towel between your skin and the bag. ? Leave the ice on for 20 minutes, 2-3 times a day. Stop putting ice on if it does not help with the pain.  Squeeze a soft ball or a foam pad as much as possible. This prevents swelling in the shoulder. It also helps to strengthen the arm. General instructions  Take  over-the-counter and prescription medicines only as told by your doctor.  Keep all follow-up visits as told by your doctor. This is important. Contact a doctor if:  Your pain gets worse.  Medicine does not help your pain.  You have new pain in your arm, hand, or fingers. Get help right away if:  Your arm, hand, or fingers: ? Tingle. ? Are numb. ? Are swollen. ? Are painful. ? Turn white or blue. Summary  Shoulder pain can be caused by many things. These include injury, moving the shoulder in the same away again and again, and joint pain.  Watch for changes in your symptoms. Let your doctor know about them.  This condition may be treated with a sling, ice, and pain medicine.  Contact your doctor if the pain gets worse or you have new pain. Get help right away if your arm, hand, or fingers tingle or get numb, swollen, or painful.  Keep all follow-up visits as told by your doctor. This is important. This information is not intended to replace advice given to you by your health care provider. Make sure you discuss any questions you have with your health care provider. Document Revised: 07/12/2017 Document Reviewed: 07/12/2017 Elsevier Patient Education  2020 Elsevier Inc.      Agustina Caroli, MD [facility]

## 2019-10-03 LAB — GC/CHLAMYDIA PROBE AMP (~~LOC~~) NOT AT ARMC
Chlamydia: NEGATIVE
Comment: NEGATIVE
Comment: NORMAL
Neisseria Gonorrhea: NEGATIVE

## 2019-10-03 LAB — RPR+HSVIGM+HBSAG+HSV2(IGG)+...
HIV Screen 4th Generation wRfx: NONREACTIVE
HSV 2 IgG, Type Spec: <0.91 index (ref 0.00–0.90)
HSVI/II Comb IgM: <0.91 Ratio (ref 0.00–0.90)
Hepatitis B Surface Ag: NEGATIVE
RPR Ser Ql: NONREACTIVE

## 2019-12-04 ENCOUNTER — Encounter: Payer: Medicaid Other | Admitting: Emergency Medicine

## 2020-02-27 ENCOUNTER — Encounter: Payer: Medicaid Other | Admitting: Emergency Medicine

## 2020-03-25 ENCOUNTER — Other Ambulatory Visit (HOSPITAL_COMMUNITY)
Admission: RE | Admit: 2020-03-25 | Discharge: 2020-03-25 | Disposition: A | Discharge status: Home / Self Care | Payer: Medicaid Other | Source: Ambulatory Visit | Attending: Emergency Medicine | Admitting: Emergency Medicine

## 2020-03-25 ENCOUNTER — Encounter: Payer: Self-pay | Admitting: Emergency Medicine

## 2020-03-25 ENCOUNTER — Ambulatory Visit: Payer: Medicaid Other | Admitting: Emergency Medicine

## 2020-03-25 ENCOUNTER — Ambulatory Visit (INDEPENDENT_AMBULATORY_CARE_PROVIDER_SITE_OTHER): Payer: Medicaid Other | Admitting: Emergency Medicine

## 2020-03-25 VITALS — BP 140/73 | HR 64 | Temp 98.6°F | Ht 70.0 in | Wt 156.6 lb

## 2020-03-25 DIAGNOSIS — R300 Dysuria: Secondary | ICD-10-CM

## 2020-03-25 DIAGNOSIS — Z113 Encounter for screening for infections with a predominantly sexual mode of transmission: Secondary | ICD-10-CM | POA: Insufficient documentation

## 2020-03-25 DIAGNOSIS — Z0000 Encounter for general adult medical examination without abnormal findings: Secondary | ICD-10-CM

## 2020-03-25 LAB — POCT URINALYSIS DIP (MANUAL ENTRY)
Bilirubin, UA: NEGATIVE
Blood, UA: NEGATIVE
Glucose, UA: NEGATIVE mg/dL
Ketones, POC UA: NEGATIVE mg/dL
Leukocytes, UA: NEGATIVE
Nitrite, UA: NEGATIVE
Protein Ur, POC: NEGATIVE mg/dL
Spec Grav, UA: 1.02 (ref 1.010–1.025)
Urobilinogen, UA: 0.2 E.U./dL
pH, UA: 8.5 — AB (ref 5.0–8.0)

## 2020-03-25 MED ORDER — VALACYCLOVIR HCL 500 MG PO TABS: 500.0000 mg | ORAL_TABLET | Freq: Every day | ORAL | 3 refills | Status: DC

## 2020-03-25 NOTE — Progress Notes (Signed)
Mason Quinn 30 y.o.   Chief Complaint  Patient presents with  . Annual Exam    Little burning when urinate since 03/23/20    HISTORY OF PRESENT ILLNESS: This is a 30 y.o. male here for his annual exam. Sexually active.  Complaints of urinary discomfort after he pees. No other complaints or medical concerns today.  HPI   Prior to Admission medications   Not on File    No Known Allergies  There are no problems to display for this patient.   No past medical history on file.  No past surgical history on file.  Social History   Socioeconomic History  . Marital status: Single    Spouse name: n/a  . Number of children: 1  . Years of education: college  . Highest education level: Not on file  Occupational History  . Occupation: Database administrator    Comment: makes paint  Tobacco Use  . Smoking status: Never Smoker  . Smokeless tobacco: Never Used  Substance and Sexual Activity  . Alcohol use: No    Alcohol/week: 0.0 standard drinks  . Drug use: No  . Sexual activity: Yes    Partners: Female  Other Topics Concern  . Not on file  Social History Narrative   Lives alone.   Family lives in [place], [place].   Social Determinants of Health   Financial Resource Strain: Not on file  Food Insecurity: Not on file  Transportation Needs: Not on file  Physical Activity: Not on file  Stress: Not on file  Social Connections: Not on file  Intimate Partner Violence: Not on file    Family History  Problem Relation Age of Onset  . Cancer Mother        Melonoma in the Brain  . Hypertension Mother   . Cancer Paternal Grandmother   . Cancer Paternal Aunt      Review of Systems  Constitutional: Negative.  Negative for chills and fever.  HENT: Negative.  Negative for congestion and sore throat.   Respiratory: Negative.  Negative for cough and shortness of breath.   Cardiovascular: Negative.  Negative for chest pain and palpitations.  Gastrointestinal: Negative.   Negative for abdominal pain, blood in stool, diarrhea, melena, nausea and vomiting.  Genitourinary: Positive for dysuria. Negative for flank pain, frequency and hematuria.  Musculoskeletal: Negative.  Negative for back pain, myalgias and neck pain.  Skin: Negative.  Negative for rash.  Neurological: Negative.  Negative for dizziness and headaches.  All other systems reviewed and are negative.  Today's Vitals   03/25/20 1437  BP: 140/73  Pulse: 64  Temp: 98.6 F (37 C)  TempSrc: Temporal  SpO2: (!) 64%  Weight: 156 lb 9.6 oz (71 kg)  Height: 5\' 10"  (1.778 m)   Body mass index is 22.47 kg/m.   Physical Exam Vitals reviewed.  Constitutional:      Appearance: Normal appearance.  HENT:     Head: Normocephalic.     Mouth/Throat:     Mouth: Mucous membranes are moist.     Pharynx: Oropharynx is clear.  Eyes:     Extraocular Movements: Extraocular movements intact.     Conjunctiva/sclera: Conjunctivae normal.     Pupils: Pupils are equal, round, and reactive to light.  Cardiovascular:     Rate and Rhythm: Normal rate and regular rhythm.     Pulses: Normal pulses.     Heart sounds: Normal heart sounds.  Pulmonary:     Effort: Pulmonary effort is  normal.     Breath sounds: Normal breath sounds.  Abdominal:     General: Bowel sounds are normal. There is no distension.     Palpations: Abdomen is soft. There is no mass.     Tenderness: There is no abdominal tenderness.  Musculoskeletal:        General: Normal range of motion.     Cervical back: Normal range of motion and neck supple. No tenderness.  Lymphadenopathy:     Cervical: No cervical adenopathy.  Skin:    General: Skin is warm and dry.     Capillary Refill: Capillary refill takes less than 2 seconds.  Neurological:     General: No focal deficit present.     Mental Status: He is alert and oriented to person, place, and time.  Psychiatric:        Mood and Affect: Mood normal.        Behavior: Behavior normal.     Results for orders placed or performed in visit on 03/25/20 (from the past 24 hour(s))  POCT urinalysis dipstick     Status: Abnormal   Collection Time: 03/25/20  3:20 PM  Result Value Ref Range   Color, UA yellow yellow   Clarity, UA clear clear   Glucose, UA negative negative mg/dL   Bilirubin, UA negative negative   Ketones, POC UA negative negative mg/dL   Spec Grav, UA 1.020 1.010 - 1.025   Blood, UA negative negative   pH, UA 8.5 (A) 5.0 - 8.0   Protein Ur, POC negative negative mg/dL   Urobilinogen, UA 0.2 0.2 or 1.0 E.U./dL   Nitrite, UA Negative Negative   Leukocytes, UA Negative Negative     ASSESSMENT & PLAN: [place] was seen today for annual exam.  Diagnoses and all orders for this visit:  Dysuria -     Cancel: GC/Chlamydia Probe Amp -     HIV Antibody (routine testing w rflx) -     RPR -     valACYclovir (VALTREX) 500 MG tablet; Take 1 tablet (500 mg total) by mouth daily. -     POCT urinalysis dipstick -     CBC with Differential/Platelet -     Comprehensive metabolic panel  Screening examination for STD (sexually transmitted disease) -     GC/Chlamydia probe amp ([place])not at [facility]  Routine general medical examination at a health care facility    Patient Instructions       If you have lab work done today you will be contacted with your lab results within the next 2 weeks.  If you have not heard from [place] then please contact us. The fastest way to get your results is to register for My Chart.   IF you received an x-ray today, you will receive an invoice from [facility]. Please contact [facility] at [phone] with questions or concerns regarding your invoice.   IF you received labwork today, you will receive an invoice from [facility]. Please contact [facility] at [phone] with questions or concerns regarding your invoice.   Our billing staff will not be able to assist you with questions regarding bills from these  companies.  You will be contacted with the lab results as soon as they are available. The fastest way to get your results is to activate your My Chart account. Instructions are located on the last page of this paperwork. If you have not heard from [place] regarding the results in 2 weeks, please contact this office.  Health Maintenance, Male Adopting a healthy lifestyle and getting preventive care are important in promoting health and wellness. Ask your health care provider about:  The right schedule for you to have regular tests and exams.  Things you can do on your own to prevent diseases and keep yourself healthy. What should I know about diet, weight, and exercise? Eat a healthy diet  Eat a diet that includes plenty of vegetables, fruits, low-fat dairy products, and lean protein.  Do not eat a lot of foods that are high in solid fats, added sugars, or sodium.   Maintain a healthy weight Body mass index (BMI) is a measurement that can be used to identify possible weight problems. It estimates body fat based on height and weight. Your health care provider can help determine your BMI and help you achieve or maintain a healthy weight. Get regular exercise Get regular exercise. This is one of the most important things you can do for your health. Most adults should:  Exercise for at least 150 minutes each week. The exercise should increase your heart rate and make you sweat (moderate-intensity exercise).  Do strengthening exercises at least twice a week. This is in addition to the moderate-intensity exercise.  Spend less time sitting. Even light physical activity can be beneficial. Watch cholesterol and blood lipids Have your blood tested for lipids and cholesterol at 29 years of age, then have this test every 5 years. You may need to have your cholesterol levels checked more often if:  Your lipid or cholesterol levels are high.  You are older than 30 years of age.  You are at high  risk for heart disease. What should I know about cancer screening? Many types of cancers can be detected early and may often be prevented. Depending on your health history and family history, you may need to have cancer screening at various ages. This may include screening for:  Colorectal cancer.  Prostate cancer.  Skin cancer.  Lung cancer. What should I know about heart disease, diabetes, and high blood pressure? Blood pressure and heart disease  High blood pressure causes heart disease and increases the risk of stroke. This is more likely to develop in people who have high blood pressure readings, are of African descent, or are overweight.  Talk with your health care provider about your target blood pressure readings.  Have your blood pressure checked: ? Every 3-5 years if you are 35-43 years of age. ? Every year if you are 53 years old or older.  If you are between the ages of 35 and 46 and are a current or former smoker, ask your health care provider if you should have a one-time screening for abdominal aortic aneurysm (AAA). Diabetes Have regular diabetes screenings. This checks your fasting blood sugar level. Have the screening done:  Once every three years after age 56 if you are at a normal weight and have a low risk for diabetes.  More often and at a younger age if you are overweight or have a high risk for diabetes. What should I know about preventing infection? Hepatitis B If you have a higher risk for hepatitis B, you should be screened for this virus. Talk with your health care provider to find out if you are at risk for hepatitis B infection. Hepatitis C Blood testing is recommended for:  Everyone born from [birth year] through 1965.  Anyone with known risk factors for hepatitis C. Sexually transmitted infections (STIs)  You should be screened each  year for STIs, including gonorrhea and chlamydia, if: ? You are sexually active and are younger than 30 years of  age. ? You are older than 30 years of age and your health care provider tells you that you are at risk for this type of infection. ? Your sexual activity has changed since you were last screened, and you are at increased risk for chlamydia or gonorrhea. Ask your health care provider if you are at risk.  Ask your health care provider about whether you are at high risk for HIV. Your health care provider may recommend a prescription medicine to help prevent HIV infection. If you choose to take medicine to prevent HIV, you should first get tested for HIV. You should then be tested every 3 months for as long as you are taking the medicine. Follow these instructions at home: Lifestyle  Do not use any products that contain nicotine or tobacco, such as cigarettes, e-cigarettes, and chewing tobacco. If you need help quitting, ask your health care provider.  Do not use street drugs.  Do not share needles.  Ask your health care provider for help if you need support or information about quitting drugs. Alcohol use  Do not drink alcohol if your health care provider tells you not to drink.  If you drink alcohol: ? Limit how much you have to 0-2 drinks a day. ? Be aware of how much alcohol is in your drink. In the [place] one drink equals one 12 oz bottle of beer (355 mL), one 5 oz glass of wine (148 mL), or one 1 oz glass of hard liquor (44 mL). General instructions  Schedule regular health, dental, and eye exams.  Stay current with your vaccines.  Tell your health care provider if: ? You often feel depressed. ? You have ever been abused or do not feel safe at home. Summary  Adopting a healthy lifestyle and getting preventive care are important in promoting health and wellness.  Follow your health care provider's instructions about healthy diet, exercising, and getting tested or screened for diseases.  Follow your health care provider's instructions on monitoring your cholesterol and blood  pressure. This information is not intended to replace advice given to you by your health care provider. Make sure you discuss any questions you have with your health care provider. Document Revised: 12/21/2017 Document Reviewed: 12/21/2017 Elsevier Patient Education  2021 Elsevier Inc.       Agustina Caroli, MD [facility]

## 2020-03-25 NOTE — Patient Instructions (Addendum)
   If you have lab work done today you will be contacted with your lab results within the next 2 weeks.  If you have not heard from us then please contact us. The fastest way to get your results is to register for My Chart.   IF you received an x-ray today, you will receive an invoice from [facility]. Please contact [facility] at [phone] with questions or concerns regarding your invoice.   IF you received labwork today, you will receive an invoice from [facility]. Please contact [facility] at [phone] with questions or concerns regarding your invoice.   Our billing staff will not be able to assist you with questions regarding bills from these companies.  You will be contacted with the lab results as soon as they are available. The fastest way to get your results is to activate your My Chart account. Instructions are located on the last page of this paperwork. If you have not heard from us regarding the results in 2 weeks, please contact this office.      Health Maintenance, Male Adopting a healthy lifestyle and getting preventive care are important in promoting health and wellness. Ask your health care provider about:  The right schedule for you to have regular tests and exams.  Things you can do on your own to prevent diseases and keep yourself healthy. What should I know about diet, weight, and exercise? Eat a healthy diet  Eat a diet that includes plenty of vegetables, fruits, low-fat dairy products, and lean protein.  Do not eat a lot of foods that are high in solid fats, added sugars, or sodium.   Maintain a healthy weight Body mass index (BMI) is a measurement that can be used to identify possible weight problems. It estimates body fat based on height and weight. Your health care provider can help determine your BMI and help you achieve or maintain a healthy weight. Get regular exercise Get regular exercise. This is one of the most important things you  can do for your health. Most adults should:  Exercise for at least 150 minutes each week. The exercise should increase your heart rate and make you sweat (moderate-intensity exercise).  Do strengthening exercises at least twice a week. This is in addition to the moderate-intensity exercise.  Spend less time sitting. Even light physical activity can be beneficial. Watch cholesterol and blood lipids Have your blood tested for lipids and cholesterol at 30 years of age, then have this test every 5 years. You may need to have your cholesterol levels checked more often if:  Your lipid or cholesterol levels are high.  You are older than 30 years of age.  You are at high risk for heart disease. What should I know about cancer screening? Many types of cancers can be detected early and may often be prevented. Depending on your health history and family history, you may need to have cancer screening at various ages. This may include screening for:  Colorectal cancer.  Prostate cancer.  Skin cancer.  Lung cancer. What should I know about heart disease, diabetes, and high blood pressure? Blood pressure and heart disease  High blood pressure causes heart disease and increases the risk of stroke. This is more likely to develop in people who have high blood pressure readings, are of African descent, or are overweight.  Talk with your health care provider about your target blood pressure readings.  Have your blood pressure checked: ? Every 3-5 years if you are   18-39 years of age. ? Every year if you are 40 years old or older.  If you are between the ages of 65 and 75 and are a current or former smoker, ask your health care provider if you should have a one-time screening for abdominal aortic aneurysm (AAA). Diabetes Have regular diabetes screenings. This checks your fasting blood sugar level. Have the screening done:  Once every three years after age 45 if you are at a normal weight and have  a low risk for diabetes.  More often and at a younger age if you are overweight or have a high risk for diabetes. What should I know about preventing infection? Hepatitis B If you have a higher risk for hepatitis B, you should be screened for this virus. Talk with your health care provider to find out if you are at risk for hepatitis B infection. Hepatitis C Blood testing is recommended for:  Everyone born from 1945 through 1965.  Anyone with known risk factors for hepatitis C. Sexually transmitted infections (STIs)  You should be screened each year for STIs, including gonorrhea and chlamydia, if: ? You are sexually active and are younger than 30 years of age. ? You are older than 30 years of age and your health care provider tells you that you are at risk for this type of infection. ? Your sexual activity has changed since you were last screened, and you are at increased risk for chlamydia or gonorrhea. Ask your health care provider if you are at risk.  Ask your health care provider about whether you are at high risk for HIV. Your health care provider may recommend a prescription medicine to help prevent HIV infection. If you choose to take medicine to prevent HIV, you should first get tested for HIV. You should then be tested every 3 months for as long as you are taking the medicine. Follow these instructions at home: Lifestyle  Do not use any products that contain nicotine or tobacco, such as cigarettes, e-cigarettes, and chewing tobacco. If you need help quitting, ask your health care provider.  Do not use street drugs.  Do not share needles.  Ask your health care provider for help if you need support or information about quitting drugs. Alcohol use  Do not drink alcohol if your health care provider tells you not to drink.  If you drink alcohol: ? Limit how much you have to 0-2 drinks a day. ? Be aware of how much alcohol is in your drink. In the [place] one drink equals one 12  oz bottle of beer (355 mL), one 5 oz glass of wine (148 mL), or one 1 oz glass of hard liquor (44 mL). General instructions  Schedule regular health, dental, and eye exams.  Stay current with your vaccines.  Tell your health care provider if: ? You often feel depressed. ? You have ever been abused or do not feel safe at home. Summary  Adopting a healthy lifestyle and getting preventive care are important in promoting health and wellness.  Follow your health care provider's instructions about healthy diet, exercising, and getting tested or screened for diseases.  Follow your health care provider's instructions on monitoring your cholesterol and blood pressure. This information is not intended to replace advice given to you by your health care provider. Make sure you discuss any questions you have with your health care provider. Document Revised: 12/21/2017 Document Reviewed: 12/21/2017 Elsevier Patient Education  2021 Elsevier Inc.  

## 2020-03-26 LAB — GC/CHLAMYDIA PROBE AMP (~~LOC~~) NOT AT ARMC
Chlamydia: NEGATIVE
Comment: NEGATIVE
Comment: NORMAL
Neisseria Gonorrhea: NEGATIVE

## 2020-03-29 LAB — CBC WITH DIFFERENTIAL/PLATELET
Basophils Absolute: 0.1 10*3/uL (ref 0.0–0.2)
Basos: 2 %
EOS (ABSOLUTE): 0.1 10*3/uL (ref 0.0–0.4)
Eos: 1 %
Hematocrit: 43.7 % (ref 37.5–51.0)
Hemoglobin: 14.3 g/dL (ref 13.0–17.7)
Immature Grans (Abs): 0 10*3/uL (ref 0.0–0.1)
Immature Granulocytes: 0 %
Lymphocytes Absolute: 1.7 10*3/uL (ref 0.7–3.1)
Lymphs: 44 %
MCH: 28.9 pg (ref 26.6–33.0)
MCHC: 32.7 g/dL (ref 31.5–35.7)
MCV: 88 fL (ref 79–97)
Monocytes Absolute: 0.4 10*3/uL (ref 0.1–0.9)
Monocytes: 9 %
Neutrophils Absolute: 1.7 10*3/uL (ref 1.4–7.0)
Neutrophils: 44 %
Platelets: 239 10*3/uL (ref 150–450)
RBC: 4.95 x10E6/uL (ref 4.14–5.80)
RDW: 13.2 % (ref 11.6–15.4)
WBC: 3.9 10*3/uL (ref 3.4–10.8)

## 2020-03-29 LAB — COMPREHENSIVE METABOLIC PANEL
ALT: 15 IU/L (ref 0–44)
AST: 25 IU/L (ref 0–40)
Albumin/Globulin Ratio: 1.9 (ref 1.2–2.2)
Albumin: 4.5 g/dL (ref 4.1–5.2)
Alkaline Phosphatase: 54 IU/L (ref 44–121)
BUN/Creatinine Ratio: 9 (ref 9–20)
BUN: 11 mg/dL (ref 6–20)
Bilirubin Total: 0.6 mg/dL (ref 0.0–1.2)
CO2: 25 mmol/L (ref 20–29)
Calcium: 9.3 mg/dL (ref 8.7–10.2)
Chloride: 103 mmol/L (ref 96–106)
Creatinine, Ser: 1.16 mg/dL (ref 0.76–1.27)
Globulin, Total: 2.4 g/dL (ref 1.5–4.5)
Glucose: 70 mg/dL (ref 65–99)
Potassium: 4.1 mmol/L (ref 3.5–5.2)
Sodium: 140 mmol/L (ref 134–144)
Total Protein: 6.9 g/dL (ref 6.0–8.5)
eGFR: 87 mL/min/{1.73_m2} (ref >59)

## 2020-03-29 LAB — HIV ANTIBODY (ROUTINE TESTING W REFLEX): HIV Screen 4th Generation wRfx: NONREACTIVE

## 2020-03-29 LAB — RPR: RPR Ser Ql: NONREACTIVE

## 2020-04-01 ENCOUNTER — Encounter: Payer: Self-pay | Admitting: Emergency Medicine

## 2020-04-01 DIAGNOSIS — Z1159 Encounter for screening for other viral diseases: Secondary | ICD-10-CM

## 2020-04-03 NOTE — Telephone Encounter (Signed)
Yes

## 2020-04-03 NOTE — Telephone Encounter (Signed)
Pt asking about Hep B and C results I do not see that ordered.   Could you please comment on other results though.   Thank you

## 2020-04-03 NOTE — Telephone Encounter (Signed)
Hepatitis B and C tests were not run this time around.  Thanks.

## 2020-04-03 NOTE — Telephone Encounter (Signed)
Other labs all okay?

## 2020-04-15 ENCOUNTER — Encounter: Payer: Self-pay | Admitting: Emergency Medicine

## 2020-04-22 NOTE — Telephone Encounter (Signed)
Please advise 

## 2020-04-30 NOTE — Telephone Encounter (Signed)
LVM for pt to call back for PCP response:  "Does not need prescription.  Recommend over-the-counter Zyrtec and nasal spray Flonase."

## 2020-04-30 NOTE — Telephone Encounter (Signed)
Does not need prescription.  Recommend over-the-counter Zyrtec and nasal spray Flonase.  Call patient please.  Thanks.

## 2020-05-21 ENCOUNTER — Ambulatory Visit: Payer: Medicaid Other | Admitting: Emergency Medicine

## 2020-07-16 ENCOUNTER — Ambulatory Visit: Payer: Medicaid Other | Admitting: Emergency Medicine

## 2020-07-28 ENCOUNTER — Ambulatory Visit: Payer: Medicaid Other | Admitting: Emergency Medicine

## 2020-07-30 ENCOUNTER — Ambulatory Visit: Payer: Medicaid Other | Admitting: Emergency Medicine

## 2020-10-29 ENCOUNTER — Ambulatory Visit: Payer: Medicaid Other | Admitting: Emergency Medicine

## 2020-11-05 ENCOUNTER — Encounter: Payer: Self-pay | Admitting: Emergency Medicine

## 2020-11-05 DIAGNOSIS — R300 Dysuria: Secondary | ICD-10-CM

## 2020-11-10 MED ORDER — VALACYCLOVIR HCL 500 MG PO TABS: 500.0000 mg | ORAL_TABLET | Freq: Every day | ORAL | 3 refills | Status: DC

## 2020-11-10 NOTE — Telephone Encounter (Signed)
Refilled medication

## 2020-11-11 ENCOUNTER — Ambulatory Visit: Payer: Medicaid Other | Admitting: Emergency Medicine

## 2020-11-12 ENCOUNTER — Ambulatory Visit: Payer: Medicaid Other | Admitting: Emergency Medicine

## 2021-01-19 ENCOUNTER — Encounter: Payer: Self-pay | Admitting: Emergency Medicine

## 2021-01-29 ENCOUNTER — Ambulatory Visit: Payer: Medicaid Other | Admitting: Emergency Medicine

## 2021-02-03 ENCOUNTER — Encounter: Payer: Self-pay | Admitting: Emergency Medicine

## 2021-02-09 NOTE — Telephone Encounter (Signed)
Thank you :)

## 2021-07-17 ENCOUNTER — Telehealth: Payer: Self-pay | Admitting: Emergency Medicine

## 2021-07-17 ENCOUNTER — Ambulatory Visit (INDEPENDENT_AMBULATORY_CARE_PROVIDER_SITE_OTHER): Payer: Self-pay | Admitting: Nurse Practitioner

## 2021-07-17 VITALS — BP 130/82 | HR 86 | Temp 98.7°F | Ht 70.0 in | Wt 155.0 lb

## 2021-07-17 DIAGNOSIS — Z136 Encounter for screening for cardiovascular disorders: Secondary | ICD-10-CM

## 2021-07-17 DIAGNOSIS — R300 Dysuria: Secondary | ICD-10-CM | POA: Insufficient documentation

## 2021-07-17 DIAGNOSIS — L819 Disorder of pigmentation, unspecified: Secondary | ICD-10-CM | POA: Insufficient documentation

## 2021-07-17 DIAGNOSIS — Z13220 Encounter for screening for lipoid disorders: Secondary | ICD-10-CM

## 2021-07-17 DIAGNOSIS — R41840 Attention and concentration deficit: Secondary | ICD-10-CM

## 2021-07-17 DIAGNOSIS — E785 Hyperlipidemia, unspecified: Secondary | ICD-10-CM

## 2021-07-17 LAB — COMPREHENSIVE METABOLIC PANEL
ALT: 18 U/L (ref 0–53)
AST: 19 U/L (ref 0–37)
Albumin: 5 g/dL (ref 3.5–5.2)
Alkaline Phosphatase: 48 U/L (ref 39–117)
BUN: 17 mg/dL (ref 6–23)
CO2: 28 mEq/L (ref 19–32)
Calcium: 9.6 mg/dL (ref 8.4–10.5)
Chloride: 103 mEq/L (ref 96–112)
Creatinine, Ser: 1.3 mg/dL (ref 0.40–1.50)
GFR: 73.37 mL/min (ref >60.00)
Glucose, Bld: 95 mg/dL (ref 70–99)
Potassium: 3.7 mEq/L (ref 3.5–5.1)
Sodium: 139 mEq/L (ref 135–145)
Total Bilirubin: 0.8 mg/dL (ref 0.2–1.2)
Total Protein: 7.8 g/dL (ref 6.0–8.3)

## 2021-07-17 LAB — CBC
HCT: 44.8 % (ref 39.0–52.0)
Hemoglobin: 15.2 g/dL (ref 13.0–17.0)
MCHC: 33.9 g/dL (ref 30.0–36.0)
MCV: 87.1 fl (ref 78.0–100.0)
Platelets: 261 10*3/uL (ref 150.0–400.0)
RBC: 5.15 Mil/uL (ref 4.22–5.81)
RDW: 13.8 % (ref 11.5–15.5)
WBC: 4.3 10*3/uL (ref 4.0–10.5)

## 2021-07-17 LAB — LIPID PANEL
Cholesterol: 216 mg/dL — ABNORMAL HIGH (ref 0–200)
HDL: 70.4 mg/dL (ref >39.00)
LDL Cholesterol: 137 mg/dL — ABNORMAL HIGH (ref 0–99)
NonHDL: 145.39
Total CHOL/HDL Ratio: 3
Triglycerides: 40 mg/dL (ref 0.0–149.0)
VLDL: 8 mg/dL (ref 0.0–40.0)

## 2021-07-17 LAB — URINALYSIS WITH CULTURE, IF INDICATED
Bilirubin Urine: NEGATIVE
Hgb urine dipstick: NEGATIVE
Ketones, ur: NEGATIVE
Leukocytes,Ua: NEGATIVE
Nitrite: NEGATIVE
Specific Gravity, Urine: 1.02 (ref 1.000–1.030)
Total Protein, Urine: NEGATIVE
Urine Glucose: NEGATIVE
Urobilinogen, UA: 0.2 (ref 0.0–1.0)
pH: 5.5 (ref 5.0–8.0)

## 2021-07-17 LAB — HEMOGLOBIN A1C: Hgb A1c MFr Bld: 5.6 % (ref 4.6–6.5)

## 2021-07-17 LAB — TSH: TSH: 0.63 u[IU]/mL (ref 0.35–5.50)

## 2021-07-17 NOTE — Progress Notes (Signed)
Established Patient Office Visit  Subjective   Patient ID: Mason Quinn, male    DOB: March 05, 1990  Age: 31 y.o. MRN: [id number]  Chief Complaint  Patient presents with   Discomfort when urinating after intercourse    With protection    HPI - Dysuria - Patient has been having discomfort and itching with urination following ejaculation for the last 2 weeks. He has not had a new sexual partner and denies any recent trauma. He denies discharge, odor or rash. Patient would like all the STI lab testing today.  Denies visible blood in his semen. - problems focusing - Patient would like to have some medication to help him focus. 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 - hyperlipidemia - Patient has a strong family history of CVD (father with history of MI at age 41) and has had hyperlipidemia in the past and would like his cholesterol checked today.  - Hyperpigmentation following ance - Patient stated that he has some hyperpigmentation near his bottom lip following acne and was wondering if there is anything that he can use to get rid of it.     Review of Systems  Constitutional:  Negative for fever.  Respiratory:  Negative for cough and shortness of breath.   Cardiovascular:  Negative for chest pain.  Genitourinary:  Positive for dysuria. Negative for flank pain, hematuria and urgency.  Musculoskeletal:  Negative for back pain.  Skin:  Positive for itching. Negative for rash.  Psychiatric/Behavioral:         Difficulty focusing      Objective:     BP 130/82 (BP Location: Right Arm, Patient Position: Sitting, Cuff Size: Large)   Pulse 86   Temp 98.7 F (37.1 C) (Oral)   Ht '5\' 10"'$  (1.778 m)   Wt 155 lb (70.3 kg)   SpO2 95%   BMI 22.24 kg/m  BP Readings from Last 3 Encounters:  07/17/21 130/82  03/25/20 140/73  10/02/19 133/82   Wt Readings from Last 3  Encounters:  07/17/21 155 lb (70.3 kg)  03/25/20 156 lb 9.6 oz (71 kg)  10/02/19 158 lb (71.7 kg)      Physical Exam Constitutional:      Appearance: Normal appearance. He is normal weight.  HENT:     Head: Normocephalic.  Cardiovascular:     Rate and Rhythm: Normal rate and regular rhythm.     Heart sounds: Normal heart sounds.  Pulmonary:     Breath sounds: Normal breath sounds.  Musculoskeletal:     Cervical back: Normal range of motion.  Skin:    General: Skin is warm.  Neurological:     Mental Status: He is alert and oriented to person, place, and time.  Psychiatric:        Mood and Affect: Mood normal.      No results found for any visits on 07/17/21.  Last lipids Lab Results  Component Value Date   CHOL 197 11/30/2018   HDL 73 11/30/2018   LDLCALC 117 (H) 11/30/2018   TRIG 37 11/30/2018   CHOLHDL 2.7 11/30/2018      The ASCVD Risk score (Arnett DK, et al., 2019) failed to calculate for the following reasons:   The 2019 ASCVD risk score is only valid for ages 66 to 20    Assessment & Plan:   Problem List Items Addressed This Visit  Musculoskeletal and Integument   Hyperpigmentation of skin     Other   Dysuria - Primary    Labs ordered, will consider Urology pending lab results.      Relevant Orders   TSH   Hemoglobin A1c   Lipid panel   Comprehensive metabolic panel   CBC   RPR   Urinalysis with Culture, if indicated   HIV Antibody (routine testing w rflx)   HSV 1 antibody, IgG   HSV 2 antibody, IgG   GC/Chlamydia Probe Amp   Attention and concentration deficit    Referral to psychiatry for evaluation of ADHD      Relevant Orders   Ambulatory referral to Psychiatry   Encounter for lipid screening for cardiovascular disease    Labs ordered, FU with Dr. Mitchel Honour next week      Relevant Orders   Comprehensive metabolic panel   CBC   Hyperlipidemia    Labs ordered for monitoring Cholesterol was high/borderline in 2019 and  2020 respectively      Relevant Orders   Hemoglobin A1c   Lipid panel   Comprehensive metabolic panel   CBC    Return for As scheduled with PCP.    Ailene Ards, NP

## 2021-07-17 NOTE — Assessment & Plan Note (Signed)
Labs ordered, will consider Urology pending lab results.

## 2021-07-17 NOTE — Telephone Encounter (Signed)
Pt is seeking advice. He stated he has an uncomfortable sensation when urinating after ejaculating or wearing a condom. Pt is unsure the cause and was wondering could it be a latex allergy causing it.   Please advise.

## 2021-07-17 NOTE — Telephone Encounter (Signed)
Called patient to schedule an appt.

## 2021-07-17 NOTE — Assessment & Plan Note (Signed)
Chronic, stable.  Consider Tretinoin after discussion with PCP at follow-up with him.

## 2021-07-17 NOTE — Patient Instructions (Signed)
Tretinoin Topical

## 2021-07-17 NOTE — Assessment & Plan Note (Signed)
Referral to psychiatry for evaluation of ADHD

## 2021-07-17 NOTE — Assessment & Plan Note (Signed)
Labs ordered, FU with Dr. Mitchel Honour next week

## 2021-07-17 NOTE — Assessment & Plan Note (Signed)
Labs ordered for monitoring Cholesterol was high/borderline in 2019 and 2020 respectively

## 2021-07-20 LAB — GC/CHLAMYDIA PROBE AMP
Chlamydia trachomatis, NAA: NEGATIVE
Neisseria Gonorrhoeae by PCR: NEGATIVE

## 2021-07-20 LAB — HIV ANTIBODY (ROUTINE TESTING W REFLEX): HIV 1&2 Ab, 4th Generation: NONREACTIVE

## 2021-07-20 LAB — HSV 2 ANTIBODY, IGG: HSV 2 Glycoprotein G Ab, IgG: <0.9 index

## 2021-07-20 LAB — HSV 1 ANTIBODY, IGG: HSV 1 Glycoprotein G Ab, IgG: 30 index — ABNORMAL HIGH

## 2021-07-20 LAB — RPR: RPR Ser Ql: NONREACTIVE

## 2021-07-23 ENCOUNTER — Other Ambulatory Visit: Payer: Self-pay | Admitting: Nurse Practitioner

## 2021-07-23 DIAGNOSIS — R8281 Pyuria: Secondary | ICD-10-CM

## 2021-07-23 DIAGNOSIS — R300 Dysuria: Secondary | ICD-10-CM

## 2021-07-27 ENCOUNTER — Encounter: Payer: Medicaid Other | Admitting: Emergency Medicine

## 2022-03-10 IMAGING — DX DG SHOULDER 2+V*R*
3 series · 3 of 3 positions shown · non-contrast
Comparison: 06/05/2013

CLINICAL DATA: Right shoulder pain, chronic

EXAM:
RIGHT SHOULDER - 2+ VIEW

[shoulder ap]
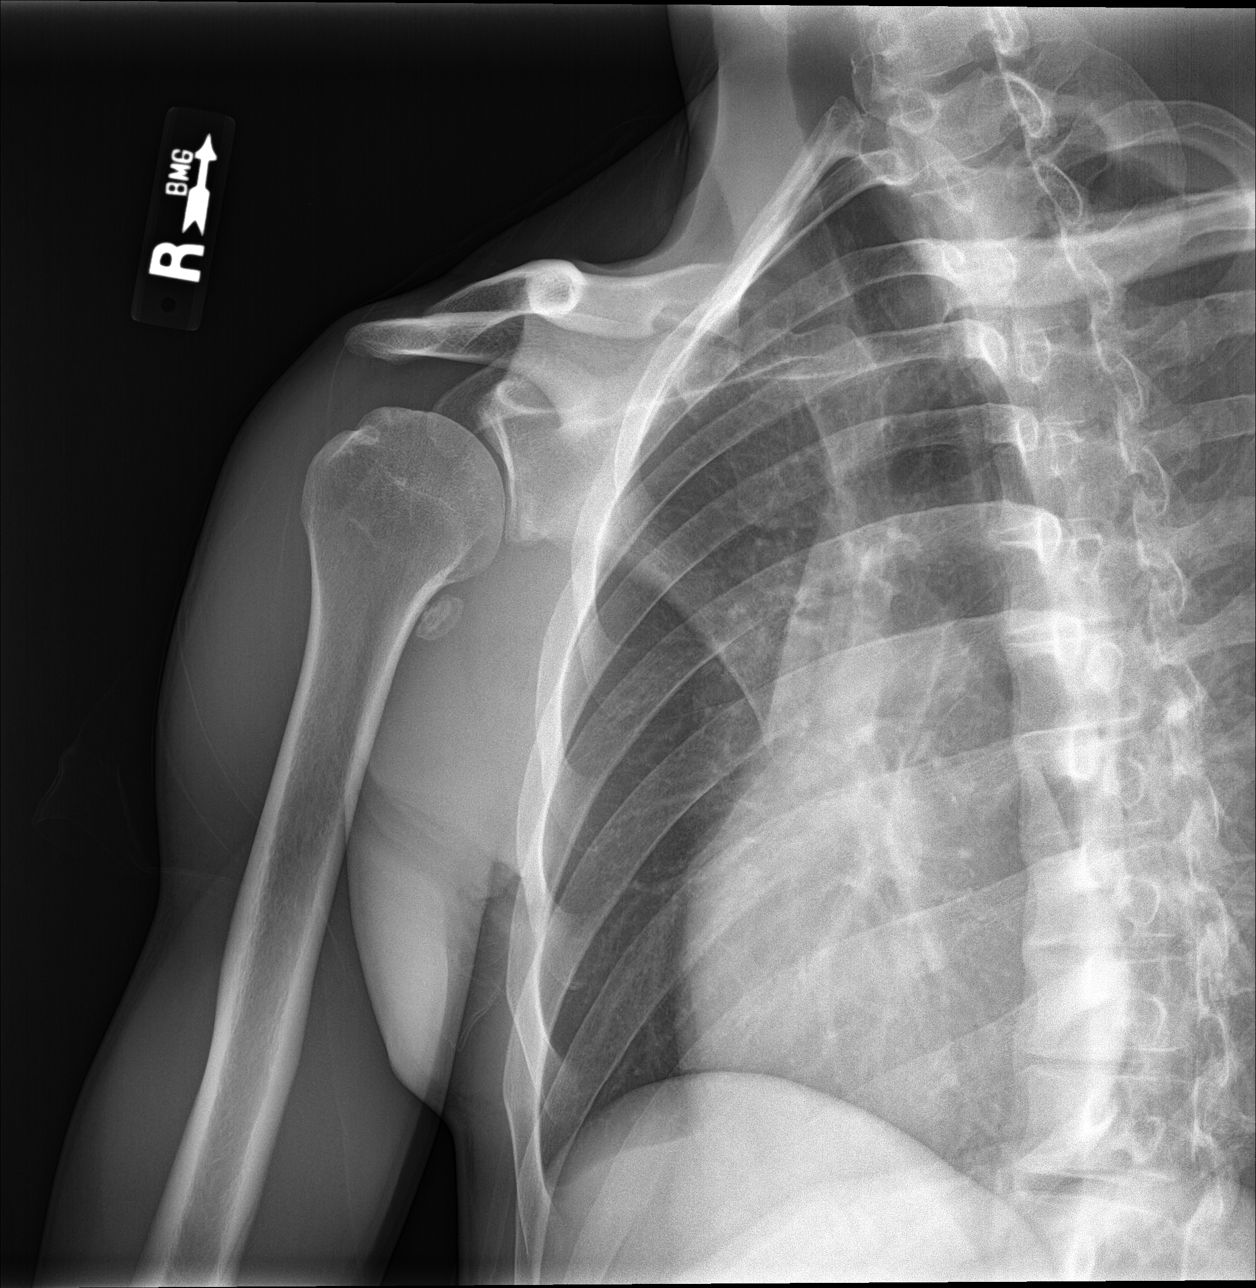

[shoulder y-view]
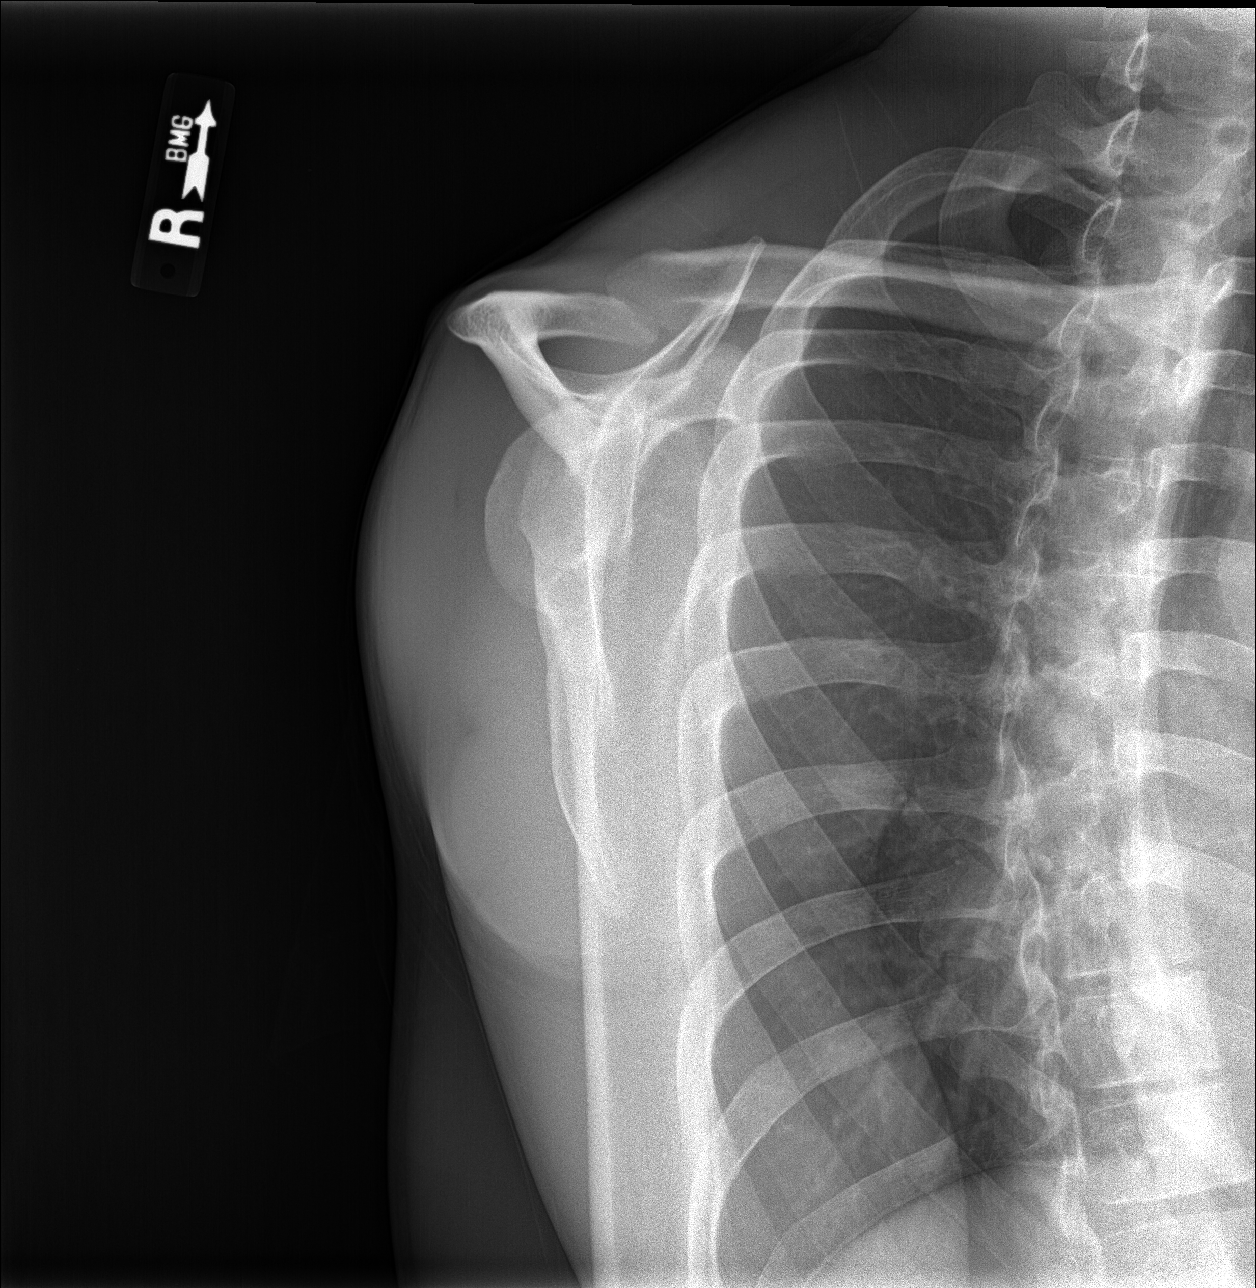

[shoulder axial]
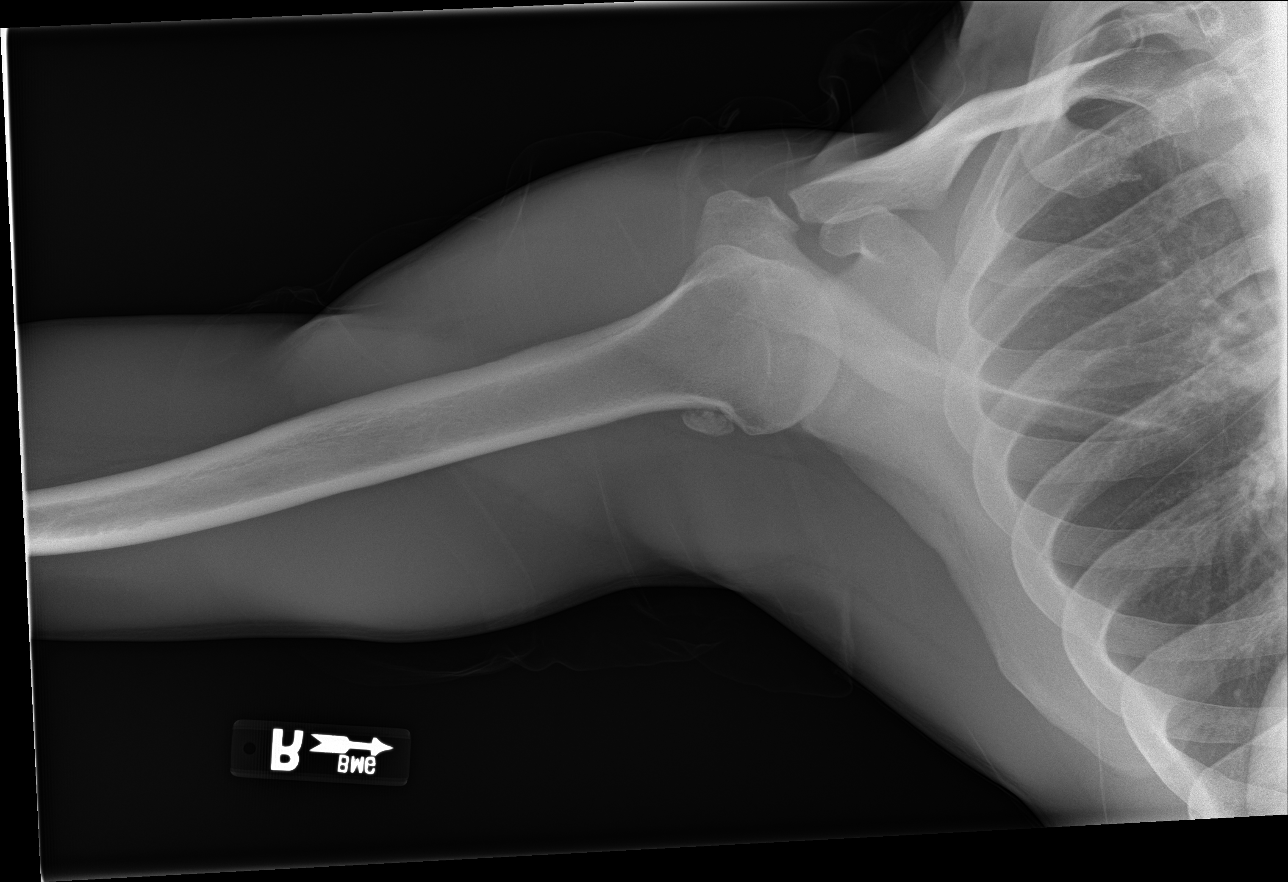

[3 of 3 positions shown; findings below may reference images not displayed]

FINDINGS: 14 mm well corticated bone density inferior to the humeral head
compatible with intra-articular loose body. This has enlarged since
prior study. Mild joint space narrowing in the glenohumeral joint.
No acute bony abnormality. Specifically, no fracture, subluxation,
or dislocation.
IMPRESSION: Joint space narrowing in the glenohumeral joint. Intra-articular
loose body. No acute bony abnormality.

## 2022-05-27 ENCOUNTER — Telehealth: Payer: Self-pay | Admitting: Emergency Medicine

## 2022-05-27 NOTE — Telephone Encounter (Signed)
Patient called and asked to schedule an appointment with Dr. Alvy Bimler. He has not seen provider in a while and has many cancellations and no-shows. Can he still be seen? Best callback is [phone]

## 2022-05-28 ENCOUNTER — Ambulatory Visit (INDEPENDENT_AMBULATORY_CARE_PROVIDER_SITE_OTHER): Payer: Medicaid Other | Admitting: Nurse Practitioner

## 2022-05-28 ENCOUNTER — Ambulatory Visit (INDEPENDENT_AMBULATORY_CARE_PROVIDER_SITE_OTHER): Payer: Medicaid Other

## 2022-05-28 ENCOUNTER — Other Ambulatory Visit: Payer: Medicaid Other

## 2022-05-28 VITALS — BP 98/62 | HR 65 | Temp 99.3°F | Ht 70.0 in | Wt 166.5 lb

## 2022-05-28 DIAGNOSIS — K921 Melena: Secondary | ICD-10-CM | POA: Diagnosis not present

## 2022-05-28 DIAGNOSIS — G8929 Other chronic pain: Secondary | ICD-10-CM

## 2022-05-28 DIAGNOSIS — Z131 Encounter for screening for diabetes mellitus: Secondary | ICD-10-CM | POA: Diagnosis not present

## 2022-05-28 DIAGNOSIS — Z0001 Encounter for general adult medical examination with abnormal findings: Secondary | ICD-10-CM

## 2022-05-28 DIAGNOSIS — R8281 Pyuria: Secondary | ICD-10-CM

## 2022-05-28 DIAGNOSIS — R369 Urethral discharge, unspecified: Secondary | ICD-10-CM

## 2022-05-28 DIAGNOSIS — M25511 Pain in right shoulder: Secondary | ICD-10-CM

## 2022-05-28 DIAGNOSIS — M5416 Radiculopathy, lumbar region: Secondary | ICD-10-CM

## 2022-05-28 DIAGNOSIS — E785 Hyperlipidemia, unspecified: Secondary | ICD-10-CM | POA: Diagnosis not present

## 2022-05-28 LAB — URINALYSIS WITH CULTURE, IF INDICATED
Bilirubin Urine: NEGATIVE
Hgb urine dipstick: NEGATIVE
Ketones, ur: NEGATIVE
Nitrite: NEGATIVE
Specific Gravity, Urine: 1.015 (ref 1.000–1.030)
Total Protein, Urine: NEGATIVE
Urine Glucose: NEGATIVE
Urobilinogen, UA: 0.2 (ref 0.0–1.0)
pH: 7 (ref 5.0–8.0)

## 2022-05-28 LAB — COMPREHENSIVE METABOLIC PANEL
ALT: 27 U/L (ref 0–53)
AST: 32 U/L (ref 0–37)
Albumin: 4.3 g/dL (ref 3.5–5.2)
Alkaline Phosphatase: 47 U/L (ref 39–117)
BUN: 10 mg/dL (ref 6–23)
CO2: 31 mEq/L (ref 19–32)
Calcium: 9.5 mg/dL (ref 8.4–10.5)
Chloride: 103 mEq/L (ref 96–112)
Creatinine, Ser: 1.3 mg/dL (ref 0.40–1.50)
GFR: 72.93 mL/min (ref >60.00)
Glucose, Bld: 86 mg/dL (ref 70–99)
Potassium: 4.3 mEq/L (ref 3.5–5.1)
Sodium: 138 mEq/L (ref 135–145)
Total Bilirubin: 0.5 mg/dL (ref 0.2–1.2)
Total Protein: 7 g/dL (ref 6.0–8.3)

## 2022-05-28 LAB — TSH: TSH: 0.6 u[IU]/mL (ref 0.35–5.50)

## 2022-05-28 LAB — LIPID PANEL
Cholesterol: 189 mg/dL (ref 0–200)
HDL: 66.4 mg/dL (ref >39.00)
LDL Cholesterol: 116 mg/dL — ABNORMAL HIGH (ref 0–99)
NonHDL: 122.47
Total CHOL/HDL Ratio: 3
Triglycerides: 33 mg/dL (ref 0.0–149.0)
VLDL: 6.6 mg/dL (ref 0.0–40.0)

## 2022-05-28 LAB — HEMOGLOBIN A1C: Hgb A1c MFr Bld: 5.4 % (ref 4.6–6.5)

## 2022-05-28 LAB — CBC
HCT: 43 % (ref 39.0–52.0)
Hemoglobin: 14.4 g/dL (ref 13.0–17.0)
MCHC: 33.4 g/dL (ref 30.0–36.0)
MCV: 87.4 fl (ref 78.0–100.0)
Platelets: 236 10*3/uL (ref 150.0–400.0)
RBC: 4.92 Mil/uL (ref 4.22–5.81)
RDW: 13.7 % (ref 11.5–15.5)
WBC: 3.7 10*3/uL — ABNORMAL LOW (ref 4.0–10.5)

## 2022-05-28 MED ORDER — DOXYCYCLINE HYCLATE 100 MG PO TABS: 100.0000 mg | ORAL_TABLET | Freq: Two times a day (BID) | ORAL | 0 refills | Status: DC

## 2022-05-28 MED ORDER — CEFTRIAXONE SODIUM 500 MG IJ SOLR
500.0000 mg | Freq: Once | INTRAMUSCULAR | Status: AC
  Administered 2022-05-28: 500 mg via INTRAMUSCULAR

## 2022-05-28 NOTE — Progress Notes (Signed)
Established Patient Office Visit  Subjective   Patient ID: Mason Quinn, male    DOB: 1990/04/16  Age: 32 y.o. MRN: [id number]  Chief Complaint  Patient presents with   Penile Discharge    Penile discharge: Acute, was told by his significant other that he needs to go see a doctor but was not told why.  Shortly after started experiencing white/yellow penile discharge.  He is concerned he may have STD.  No dysuria or hematuria noted.  Patient also requesting to have annual physical exam completed today. Health maintenance: Tdap: 11/30/2018, hep C screening: Completed Reports low back pain with right-sided numbness in his lower extremity and right shoulder pain.  Reports that he did have some trauma to that area a few months ago, but feels uncomfortable telling me the entire situation.  Also has pain to his right shoulder with similar history of trauma but feels unable to discuss further today.  No weakness to extremities, pain is mild.     Review of Systems  Constitutional:  Negative for chills, fever, malaise/fatigue and weight loss.  Eyes:  Negative for blurred vision and double vision.  Respiratory:  Negative for cough, shortness of breath and wheezing.   Cardiovascular:  Negative for chest pain and palpitations.  Gastrointestinal:  Positive for blood in stool. Negative for abdominal pain.  Genitourinary:  Negative for dysuria and hematuria.       (+) penile discharge  Neurological:  Negative for dizziness, seizures and headaches.  Psychiatric/Behavioral:  Negative for depression and suicidal ideas. The patient is not nervous/anxious.       Objective:     Pulse 65   Temp 99.3 F (37.4 C) (Temporal)   Ht 5\' 10"  (1.778 m)   Wt 166 lb 8 oz (75.5 kg)   SpO2 98%   BMI 23.89 kg/m  BP Readings from Last 3 Encounters:  07/17/21 130/82  03/25/20 140/73  10/02/19 133/82   Wt Readings from Last 3 Encounters:  05/28/22 166 lb 8 oz (75.5 kg)  07/17/21 155 lb (70.3 kg)   03/25/20 156 lb 9.6 oz (71 kg)        05/28/2022   11:22 AM 03/25/2020    2:39 PM 10/02/2019   10:06 AM  PHQ9 SCORE ONLY  PHQ-9 Total Score 0 0 0     Physical Exam Vitals reviewed.  Constitutional:      General: He is not in acute distress.    Appearance: Normal appearance. He is not ill-appearing.  HENT:     Head: Normocephalic and atraumatic.     Right Ear: Tympanic membrane, ear canal and external ear normal.     Left Ear: Tympanic membrane, ear canal and external ear normal.  Eyes:     General: No scleral icterus.    Extraocular Movements: Extraocular movements intact.     Conjunctiva/sclera: Conjunctivae normal.     Pupils: Pupils are equal, round, and reactive to light.  Neck:     Vascular: No carotid bruit.  Cardiovascular:     Rate and Rhythm: Normal rate and regular rhythm.     Pulses: Normal pulses.     Heart sounds: Normal heart sounds.  Pulmonary:     Effort: Pulmonary effort is normal.     Breath sounds: Normal breath sounds.  Abdominal:     General: Bowel sounds are normal. There is no distension.     Palpations: There is no mass.     Tenderness: There is no abdominal tenderness.  Hernia: No hernia is present.  Musculoskeletal:        General: No swelling or tenderness.     Right shoulder: No swelling, deformity or effusion. Decreased range of motion (pain with forward flexion and abduction).     Left shoulder: Normal.       Arms:     Cervical back: Normal, normal range of motion and neck supple. No rigidity.     Thoracic back: Normal.     Lumbar back: Normal.  Lymphadenopathy:     Cervical: No cervical adenopathy.  Skin:    General: Skin is warm and dry.  Neurological:     General: No focal deficit present.     Mental Status: He is alert and oriented to person, place, and time.     Cranial Nerves: No cranial nerve deficit.     Sensory: No sensory deficit.     Motor: No weakness.     Gait: Gait normal.  Psychiatric:        Mood and Affect:  Mood normal.        Behavior: Behavior normal.        Judgment: Judgment normal.      No results found for any visits on 05/28/22.    The ASCVD Risk score (Arnett DK, et al., 2019) failed to calculate for the following reasons:   The 2019 ASCVD risk score is only valid for ages 90 to 60    Assessment & Plan:   Problem List Items Addressed This Visit       Nervous and Auditory   Lumbar back pain with radiculopathy affecting right lower extremity    Stable Due to possible traumatic injury leading to pain will get x-ray today for further evaluation.  Further recommendations may be made based upon these results.      Relevant Orders   DG Lumbar Spine Complete   Ambulatory referral to Sports Medicine     Other   Hyperlipidemia    Labs ordered, further recommendations may be made based upon his results       Relevant Orders   CBC   Comprehensive metabolic panel   Hemoglobin A1c   Lipid panel   TSH   Urinalysis with Culture, if indicated   Penile discharge - Primary    Acute Test for STDs, treat for presumptive gonorrhea/chlamydia with 500mg  IM ceftriaxone once and doxycyline 100mg  BID x 7 days Patient told to avoid sexual intercourse for 7 days      Relevant Medications   doxycycline (VIBRA-TABS) 100 MG tablet   Other Relevant Orders   Chlamydia/Neisseria Gonorrhoeae RNA,TMA,Urogenital   RPR   HIV Antibody (routine testing w rflx)   CBC   Comprehensive metabolic panel   Hemoglobin A1c   Lipid panel   TSH   Urinalysis with Culture, if indicated   Encounter for general adult medical examination with abnormal findings    Labs ordered, further recommendations may be made based upon his results Encouraged healthy habits, avoiding risky behaviors.       Relevant Orders   CBC   Comprehensive metabolic panel   Hemoglobin A1c   Lipid panel   TSH   Urinalysis with Culture, if indicated   Diabetes mellitus screening    Labs ordered, further recommendations may  be made based upon his results       Relevant Orders   CBC   Comprehensive metabolic panel   Hemoglobin A1c   Lipid panel   TSH   Urinalysis with  Culture, if indicated   Blood in stool    Chronic, intermittent Often related to constipation Referral to GI made today for further evaluation      Relevant Orders   Ambulatory referral to Gastroenterology   Chronic right shoulder pain    Stable, due to having trauma around pain onset will have x-ray today completed, further recommendations may be made based on his results.      Relevant Orders   DG Shoulder Right   Ambulatory referral to Sports Medicine    Return in about 3 months (around 08/28/2022) for With Dr. Alvy Bimler.  In addition for annual physical exam also performed an office visit as detailed above.   Elenore Paddy, NP

## 2022-05-28 NOTE — Assessment & Plan Note (Signed)
Acute Test for STDs, treat for presumptive gonorrhea/chlamydia with 500mg  IM ceftriaxone once and doxycyline 100mg  BID x 7 days Patient told to avoid sexual intercourse for 7 days

## 2022-05-28 NOTE — Assessment & Plan Note (Signed)
Labs ordered, further recommendations may be made based upon his results. 

## 2022-05-28 NOTE — Assessment & Plan Note (Signed)
Stable, due to having trauma around pain onset will have x-ray today completed, further recommendations may be made based on his results.

## 2022-05-28 NOTE — Assessment & Plan Note (Signed)
Labs ordered, further recommendations may be made based upon his results Encouraged healthy habits, avoiding risky behaviors.

## 2022-05-28 NOTE — Assessment & Plan Note (Signed)
Stable Due to possible traumatic injury leading to pain will get x-ray today for further evaluation.  Further recommendations may be made based upon these results.

## 2022-05-28 NOTE — Assessment & Plan Note (Signed)
Chronic, intermittent Often related to constipation Referral to GI made today for further evaluation

## 2022-05-29 LAB — URINE CULTURE
MICRO NUMBER:: 14971528
Result:: NO GROWTH
SPECIMEN QUALITY:: ADEQUATE

## 2022-05-29 LAB — HIV ANTIBODY (ROUTINE TESTING W REFLEX): HIV 1&2 Ab, 4th Generation: NONREACTIVE

## 2022-05-29 LAB — RPR: RPR Ser Ql: NONREACTIVE

## 2022-05-30 ENCOUNTER — Encounter: Payer: Self-pay | Admitting: Nurse Practitioner

## 2022-05-31 ENCOUNTER — Telehealth: Payer: Self-pay | Admitting: Emergency Medicine

## 2022-05-31 ENCOUNTER — Other Ambulatory Visit: Payer: Self-pay | Admitting: Nurse Practitioner

## 2022-05-31 ENCOUNTER — Other Ambulatory Visit: Payer: Self-pay | Admitting: Emergency Medicine

## 2022-05-31 ENCOUNTER — Encounter: Payer: Self-pay | Admitting: Emergency Medicine

## 2022-05-31 DIAGNOSIS — G8929 Other chronic pain: Secondary | ICD-10-CM

## 2022-05-31 DIAGNOSIS — M5416 Radiculopathy, lumbar region: Secondary | ICD-10-CM

## 2022-05-31 DIAGNOSIS — R300 Dysuria: Secondary | ICD-10-CM

## 2022-05-31 MED ORDER — VALACYCLOVIR HCL 500 MG PO TABS: 500.0000 mg | ORAL_TABLET | Freq: Every day | ORAL | 3 refills | Status: DC

## 2022-05-31 NOTE — Telephone Encounter (Signed)
Prescription Request  05/31/2022  LOV: Visit date not found  What is the name of the medication or equipment?  valACYclovir (VALTREX) 500 MG tablet    Have you contacted your pharmacy to request a refill? No   Which pharmacy would you like this sent to?     [id number] [place], [place] - [address] AT [facility] [address] [place] [address] Phone: [phone] Fax: [phone]  Patient notified that their request is being sent to the clinical staff for review and that they should receive a response within 2 business days.   Please advise at Mobile [phone] (mobile)

## 2022-05-31 NOTE — Telephone Encounter (Signed)
Patient called again and is very upset that this has not been done.

## 2022-05-31 NOTE — Telephone Encounter (Signed)
New prescription for Valtrex sent to pharmacy of record today.

## 2022-05-31 NOTE — Telephone Encounter (Signed)
Patient called back and said he was seen by Jiles Prows on 05/28/2022. Patient said he was told that she would send in the medication for refill. Best callback is [phone].

## 2022-06-01 ENCOUNTER — Other Ambulatory Visit: Payer: Self-pay | Admitting: Nurse Practitioner

## 2022-06-01 DIAGNOSIS — R300 Dysuria: Secondary | ICD-10-CM

## 2022-06-01 MED ORDER — VALACYCLOVIR HCL 500 MG PO TABS
500.0000 mg | ORAL_TABLET | Freq: Every day | ORAL | 0 refills | Status: AC
Start: 2022-06-01 — End: ?

## 2022-06-01 NOTE — Progress Notes (Signed)
Estimated Creatinine Clearance: 84.2 mL/min (by C-G formula based on SCr of 1.3 mg/dL).

## 2022-06-02 ENCOUNTER — Ambulatory Visit: Payer: Self-pay | Admitting: Nurse Practitioner

## 2022-06-02 LAB — NEISSERIA GONORRHOEAE, TMA (ALTERNATE TARGET), UROGENITAL: NEISSERIA GONORRHOEAE,TMA (ALT TARGET),UROGENITAL: DETECTED — AB

## 2022-06-02 LAB — CHLAMYDIA/NEISSERIA GONORRHOEAE RNA,TMA,UROGENTIAL
C. trachomatis RNA, TMA: NOT DETECTED
N. gonorrhoeae RNA, TMA: DETECTED — AB

## 2022-06-02 NOTE — Progress Notes (Deleted)
    Aleen Sells D.[facility] [address] [place] [address] Phone: [phone]   Assessment and Plan:     There are no diagnoses linked to this encounter.  ***   Pertinent previous records reviewed include ***   Follow Up: ***     Subjective:   I, Linh Hedberg, am serving as a Neurosurgeon for Doctor Richardean Sale  Chief Complaint: low back and right shoulder pain   HPI:   06/03/2022 Patient is a 32 year old male complaining of low back and right shoulder pain. Patient states  Relevant Historical Information: ***  Additional pertinent review of systems negative.   Current Outpatient Medications:    doxycycline (VIBRA-TABS) 100 MG tablet, Take 1 tablet (100 mg total) by mouth 2 (two) times daily., Disp: 14 tablet, Rfl: 0   valACYclovir (VALTREX) 500 MG tablet, Take 1 tablet (500 mg total) by mouth daily., Disp: 90 tablet, Rfl: 0   Objective:     There were no vitals filed for this visit.    There is no height or weight on file to calculate BMI.    Physical Exam:    ***   Electronically signed by:  Aleen Sells D.[facility] 7:24 AM 06/02/22

## 2022-06-03 ENCOUNTER — Ambulatory Visit: Payer: Medicaid Other | Admitting: Sports Medicine

## 2022-08-18 ENCOUNTER — Ambulatory Visit: Payer: Medicaid Other | Admitting: Emergency Medicine

## 2022-12-14 ENCOUNTER — Ambulatory Visit (INDEPENDENT_AMBULATORY_CARE_PROVIDER_SITE_OTHER): Payer: Medicaid Other | Admitting: Emergency Medicine

## 2022-12-14 ENCOUNTER — Encounter: Payer: Self-pay | Admitting: Emergency Medicine

## 2022-12-14 ENCOUNTER — Ambulatory Visit (INDEPENDENT_AMBULATORY_CARE_PROVIDER_SITE_OTHER): Payer: Medicaid Other

## 2022-12-14 VITALS — BP 128/88 | HR 67 | Temp 98.3°F | Ht 70.0 in | Wt 163.2 lb

## 2022-12-14 DIAGNOSIS — R079 Chest pain, unspecified: Secondary | ICD-10-CM

## 2022-12-14 DIAGNOSIS — Z0001 Encounter for general adult medical examination with abnormal findings: Secondary | ICD-10-CM | POA: Diagnosis not present

## 2022-12-14 DIAGNOSIS — Z13228 Encounter for screening for other metabolic disorders: Secondary | ICD-10-CM | POA: Diagnosis not present

## 2022-12-14 DIAGNOSIS — Z1321 Encounter for screening for nutritional disorder: Secondary | ICD-10-CM | POA: Diagnosis not present

## 2022-12-14 DIAGNOSIS — Z113 Encounter for screening for infections with a predominantly sexual mode of transmission: Secondary | ICD-10-CM

## 2022-12-14 DIAGNOSIS — Z130 Encounter for screening for diseases of the blood and blood-forming organs and certain disorders involving the immune mechanism: Secondary | ICD-10-CM

## 2022-12-14 DIAGNOSIS — R41840 Attention and concentration deficit: Secondary | ICD-10-CM | POA: Diagnosis not present

## 2022-12-14 DIAGNOSIS — Z13220 Encounter for screening for lipoid disorders: Secondary | ICD-10-CM

## 2022-12-14 DIAGNOSIS — Z1329 Encounter for screening for other suspected endocrine disorder: Secondary | ICD-10-CM

## 2022-12-14 LAB — CBC WITH DIFFERENTIAL/PLATELET
Basophils Absolute: 0 10*3/uL (ref 0.0–0.1)
Basophils Relative: 1.3 % (ref 0.0–3.0)
Eosinophils Absolute: 0.1 10*3/uL (ref 0.0–0.7)
Eosinophils Relative: 3.8 % (ref 0.0–5.0)
HCT: 44.4 % (ref 39.0–52.0)
Hemoglobin: 14.6 g/dL (ref 13.0–17.0)
Lymphocytes Relative: 41.6 % (ref 12.0–46.0)
Lymphs Abs: 1.2 10*3/uL (ref 0.7–4.0)
MCHC: 32.8 g/dL (ref 30.0–36.0)
MCV: 88.4 fL (ref 78.0–100.0)
Monocytes Absolute: 0.3 10*3/uL (ref 0.1–1.0)
Monocytes Relative: 8.9 % (ref 3.0–12.0)
Neutro Abs: 1.3 10*3/uL — ABNORMAL LOW (ref 1.4–7.7)
Neutrophils Relative %: 44.4 % (ref 43.0–77.0)
Platelets: 280 10*3/uL (ref 150.0–400.0)
RBC: 5.02 Mil/uL (ref 4.22–5.81)
RDW: 14.6 % (ref 11.5–15.5)
WBC: 3 10*3/uL — ABNORMAL LOW (ref 4.0–10.5)

## 2022-12-14 LAB — LIPID PANEL
Cholesterol: 223 mg/dL — ABNORMAL HIGH (ref 0–200)
HDL: 69.8 mg/dL (ref >39.00)
LDL Cholesterol: 143 mg/dL — ABNORMAL HIGH (ref 0–99)
NonHDL: 153.37
Total CHOL/HDL Ratio: 3
Triglycerides: 50 mg/dL (ref 0.0–149.0)
VLDL: 10 mg/dL (ref 0.0–40.0)

## 2022-12-14 LAB — COMPREHENSIVE METABOLIC PANEL
ALT: 38 U/L (ref 0–53)
AST: 38 U/L — ABNORMAL HIGH (ref 0–37)
Albumin: 4.9 g/dL (ref 3.5–5.2)
Alkaline Phosphatase: 52 U/L (ref 39–117)
BUN: 9 mg/dL (ref 6–23)
CO2: 31 meq/L (ref 19–32)
Calcium: 9.6 mg/dL (ref 8.4–10.5)
Chloride: 104 meq/L (ref 96–112)
Creatinine, Ser: 1.13 mg/dL (ref 0.40–1.50)
GFR: 85.95 mL/min (ref >60.00)
Glucose, Bld: 94 mg/dL (ref 70–99)
Potassium: 4.1 meq/L (ref 3.5–5.1)
Sodium: 140 meq/L (ref 135–145)
Total Bilirubin: 0.9 mg/dL (ref 0.2–1.2)
Total Protein: 7.8 g/dL (ref 6.0–8.3)

## 2022-12-14 LAB — HEMOGLOBIN A1C: Hgb A1c MFr Bld: 5.7 % (ref 4.6–6.5)

## 2022-12-14 NOTE — Patient Instructions (Signed)
Health Maintenance, Male Adopting a healthy lifestyle and getting preventive care are important in promoting health and wellness. Ask your health care provider about: The right schedule for you to have regular tests and exams. Things you can do on your own to prevent diseases and keep yourself healthy. What should I know about diet, weight, and exercise? Eat a healthy diet  Eat a diet that includes plenty of vegetables, fruits, low-fat dairy products, and lean protein. Do not eat a lot of foods that are high in solid fats, added sugars, or sodium. Maintain a healthy weight Body mass index (BMI) is a measurement that can be used to identify possible weight problems. It estimates body fat based on height and weight. Your health care provider can help determine your BMI and help you achieve or maintain a healthy weight. Get regular exercise Get regular exercise. This is one of the most important things you can do for your health. Most adults should: Exercise for at least 150 minutes each week. The exercise should increase your heart rate and make you sweat (moderate-intensity exercise). Do strengthening exercises at least twice a week. This is in addition to the moderate-intensity exercise. Spend less time sitting. Even light physical activity can be beneficial. Watch cholesterol and blood lipids Have your blood tested for lipids and cholesterol at 32 years of age, then have this test every 5 years. You may need to have your cholesterol levels checked more often if: Your lipid or cholesterol levels are high. You are older than 32 years of age. You are at high risk for heart disease. What should I know about cancer screening? Many types of cancers can be detected early and may often be prevented. Depending on your health history and family history, you may need to have cancer screening at various ages. This may include screening for: Colorectal cancer. Prostate cancer. Skin cancer. Lung  cancer. What should I know about heart disease, diabetes, and high blood pressure? Blood pressure and heart disease High blood pressure causes heart disease and increases the risk of stroke. This is more likely to develop in people who have high blood pressure readings or are overweight. Talk with your health care provider about your target blood pressure readings. Have your blood pressure checked: Every 3-5 years if you are 18-39 years of age. Every year if you are 40 years old or older. If you are between the ages of 65 and 75 and are a current or former smoker, ask your health care provider if you should have a one-time screening for abdominal aortic aneurysm (AAA). Diabetes Have regular diabetes screenings. This checks your fasting blood sugar level. Have the screening done: Once every three years after age 45 if you are at a normal weight and have a low risk for diabetes. More often and at a younger age if you are overweight or have a high risk for diabetes. What should I know about preventing infection? Hepatitis B If you have a higher risk for hepatitis B, you should be screened for this virus. Talk with your health care provider to find out if you are at risk for hepatitis B infection. Hepatitis C Blood testing is recommended for: Everyone born from 1945 through 1965. Anyone with known risk factors for hepatitis C. Sexually transmitted infections (STIs) You should be screened each year for STIs, including gonorrhea and chlamydia, if: You are sexually active and are younger than 32 years of age. You are older than 32 years of age and your   health care provider tells you that you are at risk for this type of infection. Your sexual activity has changed since you were last screened, and you are at increased risk for chlamydia or gonorrhea. Ask your health care provider if you are at risk. Ask your health care provider about whether you are at high risk for HIV. Your health care provider  may recommend a prescription medicine to help prevent HIV infection. If you choose to take medicine to prevent HIV, you should first get tested for HIV. You should then be tested every 3 months for as long as you are taking the medicine. Follow these instructions at home: Alcohol use Do not drink alcohol if your health care provider tells you not to drink. If you drink alcohol: Limit how much you have to 0-2 drinks a day. Know how much alcohol is in your drink. In the [place] one drink equals one 12 oz bottle of beer (355 mL), one 5 oz glass of wine (148 mL), or one 1 oz glass of hard liquor (44 mL). Lifestyle Do not use any products that contain nicotine or tobacco. These products include cigarettes, chewing tobacco, and vaping devices, such as e-cigarettes. If you need help quitting, ask your health care provider. Do not use street drugs. Do not share needles. Ask your health care provider for help if you need support or information about quitting drugs. General instructions Schedule regular health, dental, and eye exams. Stay current with your vaccines. Tell your health care provider if: You often feel depressed. You have ever been abused or do not feel safe at home. Summary Adopting a healthy lifestyle and getting preventive care are important in promoting health and wellness. Follow your health care provider's instructions about healthy diet, exercising, and getting tested or screened for diseases. Follow your health care provider's instructions on monitoring your cholesterol and blood pressure. This information is not intended to replace advice given to you by your health care provider. Make sure you discuss any questions you have with your health care provider. Document Revised: 05/19/2020 Document Reviewed: 05/19/2020 Elsevier Patient Education  2024 Elsevier Inc.  

## 2022-12-14 NOTE — Assessment & Plan Note (Signed)
Intermittent episodes related to heat exposure. Stable vital signs.  Unremarkable physical exam. Chest x-ray done today.  Report reviewed. Normal chest x-ray. Differential diagnosis discussed.  Most likely costochondritis. No history of heart condition.  No red flag signs or symptoms. Clinically stable.  No concerns.

## 2022-12-14 NOTE — Progress Notes (Signed)
Mason Quinn 32 y.o.   Chief Complaint  Patient presents with   Annual Exam    Patient just has general questions of supplements.     HISTORY OF PRESENT ILLNESS: This is a 32 y.o. male here for annual exam. Complaining of intermittent chest pain when exposed to heat Also concerned about mental health and unable to focus and concentrate   HPI   Prior to Admission medications   Medication Sig Start Date End Date Taking? Authorizing Provider  doxycycline (VIBRA-TABS) 100 MG tablet Take 1 tablet (100 mg total) by mouth 2 (two) times daily. 05/28/22  Yes Elenore Paddy, NP  valACYclovir (VALTREX) 500 MG tablet Take 1 tablet (500 mg total) by mouth daily. 06/01/22  Yes Elenore Paddy, NP    No Known Allergies  Patient Active Problem List   Diagnosis Date Noted   Penile discharge 05/28/2022   Chronic right shoulder pain 05/28/2022   Encounter for lipid screening for cardiovascular disease 07/17/2021   Hyperlipidemia 07/17/2021    No past medical history on file.  No past surgical history on file.  Social History   Socioeconomic History   Marital status: Single    Spouse name: n/a   Number of children: 1   Years of education: college   Highest education level: Associate degree: occupational, Scientist, product/process development, or vocational program  Occupational History   Occupation: Engineer, manufacturing    Comment: makes paint  Tobacco Use   Smoking status: Never   Smokeless tobacco: Never  Substance and Sexual Activity   Alcohol use: No    Alcohol/week: 0.0 standard drinks of alcohol   Drug use: No   Sexual activity: Yes    Partners: Female  Other Topics Concern   Not on file  Social History Narrative   Lives alone.   Family lives in [place], [place].   Social Determinants of Health   Financial Resource Strain: Low Risk  (05/27/2022)   Overall Financial Resource Strain (CARDIA)    Difficulty of Paying Living Expenses: Not hard at all  Food Insecurity: No Food Insecurity (05/27/2022)    Hunger Vital Sign    Worried About Running Out of Food in the Last Year: Never true    Ran Out of Food in the Last Year: Never true  Transportation Needs: No Transportation Needs (05/27/2022)   PRAPARE - Administrator, Civil Service (Medical): No    Lack of Transportation (Non-Medical): No  Physical Activity: Insufficiently Active (05/27/2022)   Exercise Vital Sign    Days of Exercise per Week: 2 days    Minutes of Exercise per Session: 60 min  Stress: No Stress Concern Present (05/27/2022)   [employer] of Occupational Health - Occupational Stress Questionnaire    Feeling of Stress : Not at all  Social Connections: Unknown (05/27/2022)   Social Connection and Isolation Panel [NHANES]    Frequency of Communication with Friends and Family: Never    Frequency of Social Gatherings with Friends and Family: Never    Attends Religious Services: Never    Database administrator or Organizations: No    Attends Engineer, structural: Not on file    Marital Status: Patient declined  Catering manager Violence: Not on file    Family History  Problem Relation Age of Onset   Cancer Mother        Melonoma in the Brain   Hypertension Mother    Cancer Paternal Grandmother    Cancer Paternal Aunt  Review of Systems  Constitutional: Negative.  Negative for chills and fever.  HENT: Negative.  Negative for congestion and sore throat.   Respiratory: Negative.  Negative for cough and shortness of breath.   Cardiovascular:  Positive for chest pain.  Gastrointestinal:  Negative for abdominal pain, diarrhea, nausea and vomiting.  Genitourinary: Negative.  Negative for dysuria and hematuria.  Skin: Negative.  Negative for rash.  Neurological: Negative.  Negative for dizziness and headaches.  All other systems reviewed and are negative.   Vitals:   12/14/22 0844  BP: 128/88  Pulse: 67  Temp: 98.3 F (36.8 C)  SpO2: 100%    Physical Exam Vitals reviewed.   Constitutional:      Appearance: Normal appearance.  HENT:     Head: Normocephalic.     Right Ear: Tympanic membrane, ear canal and external ear normal.     Left Ear: Tympanic membrane, ear canal and external ear normal.     Mouth/Throat:     Mouth: Mucous membranes are moist.     Pharynx: Oropharynx is clear.  Eyes:     Extraocular Movements: Extraocular movements intact.  Cardiovascular:     Rate and Rhythm: Normal rate and regular rhythm.     Pulses: Normal pulses.     Heart sounds: Normal heart sounds.  Pulmonary:     Effort: Pulmonary effort is normal.     Breath sounds: Normal breath sounds.  Abdominal:     Palpations: Abdomen is soft.     Tenderness: There is no abdominal tenderness.  Musculoskeletal:        General: Normal range of motion.     Cervical back: No tenderness.  Lymphadenopathy:     Cervical: No cervical adenopathy.  Skin:    General: Skin is warm and dry.  Neurological:     General: No focal deficit present.     Mental Status: He is alert and oriented to person, place, and time.  Psychiatric:        Mood and Affect: Mood normal.        Behavior: Behavior normal.    DG Chest 2 View  Result Date: 12/14/2022 CLINICAL DATA:  Nonspecific chest pain EXAM: CHEST - 2 VIEW COMPARISON:  X-ray 11/02/2007 FINDINGS: No consolidation, pneumothorax or effusion. No edema. Normal cardiopericardial silhouette. The extreme inferior posterior costophrenic angles are clipped off the edge of the film on the lateral view. IMPRESSION: No acute osseous abnormality. Electronically Signed   By: Karen Kays M.D.   On: 12/14/2022 09:59     ASSESSMENT & PLAN: Problem List Items Addressed This Visit       Other   Attention and concentration deficit    Active and affecting quality of life. Recommend psychiatry evaluation Referral placed today      Relevant Orders   Ambulatory referral to Psychology   Nonspecific chest pain    Intermittent episodes related to heat  exposure. Stable vital signs.  Unremarkable physical exam. Chest x-ray done today.  Report reviewed. Normal chest x-ray. Differential diagnosis discussed.  Most likely costochondritis. No history of heart condition.  No red flag signs or symptoms. Clinically stable.  No concerns.      Relevant Orders   DG Chest 2 View (Completed)   Other Visit Diagnoses     Encounter for general adult medical examination with abnormal findings    -  Primary   Relevant Orders   CBC with Differential   Comprehensive metabolic panel   Hemoglobin A1c  Lipid panel   Screen for STD (sexually transmitted disease)       Relevant Orders   RPR   HIV Antibody (routine testing w rflx)   Hepatitis C antibody   GC/Chlamydia Probe Amp   Screening for deficiency anemia       Relevant Orders   CBC with Differential   Screening for lipoid disorders       Relevant Orders   Lipid panel   Screening for endocrine, metabolic and immunity disorder       Relevant Orders   Comprehensive metabolic panel   Hemoglobin A1c      Modifiable risk factors discussed with patient. Anticipatory guidance according to age provided. The following topics were also discussed: Social Determinants of Health Smoking and cardiovascular risks associated with smoking. Diet and nutrition Benefits of exercise Cancer family history review STD screening Vaccinations review and recommendations Cardiovascular risk assessment Mental health including depression and anxiety Fall and accident prevention  Patient Instructions  Health Maintenance, Male Adopting a healthy lifestyle and getting preventive care are important in promoting health and wellness. Ask your health care provider about: The right schedule for you to have regular tests and exams. Things you can do on your own to prevent diseases and keep yourself healthy. What should I know about diet, weight, and exercise? Eat a healthy diet  Eat a diet that includes plenty  of vegetables, fruits, low-fat dairy products, and lean protein. Do not eat a lot of foods that are high in solid fats, added sugars, or sodium. Maintain a healthy weight Body mass index (BMI) is a measurement that can be used to identify possible weight problems. It estimates body fat based on height and weight. Your health care provider can help determine your BMI and help you achieve or maintain a healthy weight. Get regular exercise Get regular exercise. This is one of the most important things you can do for your health. Most adults should: Exercise for at least 150 minutes each week. The exercise should increase your heart rate and make you sweat (moderate-intensity exercise). Do strengthening exercises at least twice a week. This is in addition to the moderate-intensity exercise. Spend less time sitting. Even light physical activity can be beneficial. Watch cholesterol and blood lipids Have your blood tested for lipids and cholesterol at 32 years of age, then have this test every 5 years. You may need to have your cholesterol levels checked more often if: Your lipid or cholesterol levels are high. You are older than 32 years of age. You are at high risk for heart disease. What should I know about cancer screening? Many types of cancers can be detected early and may often be prevented. Depending on your health history and family history, you may need to have cancer screening at various ages. This may include screening for: Colorectal cancer. Prostate cancer. Skin cancer. Lung cancer. What should I know about heart disease, diabetes, and high blood pressure? Blood pressure and heart disease High blood pressure causes heart disease and increases the risk of stroke. This is more likely to develop in people who have high blood pressure readings or are overweight. Talk with your health care provider about your target blood pressure readings. Have your blood pressure checked: Every 3-5  years if you are 81-31 years of age. Every year if you are 41 years old or older. If you are between the ages of 22 and 30 and are a current or former smoker, ask your health care provider  if you should have a one-time screening for abdominal aortic aneurysm (AAA). Diabetes Have regular diabetes screenings. This checks your fasting blood sugar level. Have the screening done: Once every three years after age 53 if you are at a normal weight and have a low risk for diabetes. More often and at a younger age if you are overweight or have a high risk for diabetes. What should I know about preventing infection? Hepatitis B If you have a higher risk for hepatitis B, you should be screened for this virus. Talk with your health care provider to find out if you are at risk for hepatitis B infection. Hepatitis C Blood testing is recommended for: Everyone born from [birth year] through 1965. Anyone with known risk factors for hepatitis C. Sexually transmitted infections (STIs) You should be screened each year for STIs, including gonorrhea and chlamydia, if: You are sexually active and are younger than 32 years of age. You are older than 32 years of age and your health care provider tells you that you are at risk for this type of infection. Your sexual activity has changed since you were last screened, and you are at increased risk for chlamydia or gonorrhea. Ask your health care provider if you are at risk. Ask your health care provider about whether you are at high risk for HIV. Your health care provider may recommend a prescription medicine to help prevent HIV infection. If you choose to take medicine to prevent HIV, you should first get tested for HIV. You should then be tested every 3 months for as long as you are taking the medicine. Follow these instructions at home: Alcohol use Do not drink alcohol if your health care provider tells you not to drink. If you drink alcohol: Limit how much you have to 0-2  drinks a day. Know how much alcohol is in your drink. In the [place] one drink equals one 12 oz bottle of beer (355 mL), one 5 oz glass of wine (148 mL), or one 1 oz glass of hard liquor (44 mL). Lifestyle Do not use any products that contain nicotine or tobacco. These products include cigarettes, chewing tobacco, and vaping devices, such as e-cigarettes. If you need help quitting, ask your health care provider. Do not use street drugs. Do not share needles. Ask your health care provider for help if you need support or information about quitting drugs. General instructions Schedule regular health, dental, and eye exams. Stay current with your vaccines. Tell your health care provider if: You often feel depressed. You have ever been abused or do not feel safe at home. Summary Adopting a healthy lifestyle and getting preventive care are important in promoting health and wellness. Follow your health care provider's instructions about healthy diet, exercising, and getting tested or screened for diseases. Follow your health care provider's instructions on monitoring your cholesterol and blood pressure. This information is not intended to replace advice given to you by your health care provider. Make sure you discuss any questions you have with your health care provider. Document Revised: 05/19/2020 Document Reviewed: 05/19/2020 Elsevier Patient Education  2024 Elsevier Inc.     Edwina Barth, MD [facility]

## 2022-12-14 NOTE — Assessment & Plan Note (Signed)
Active and affecting quality of life Recommend psychiatry evaluation Referral placed today.

## 2022-12-15 LAB — HIV ANTIBODY (ROUTINE TESTING W REFLEX): HIV 1&2 Ab, 4th Generation: NONREACTIVE

## 2022-12-15 LAB — RPR: RPR Ser Ql: NONREACTIVE

## 2022-12-15 LAB — HEPATITIS C ANTIBODY: Hepatitis C Ab: NONREACTIVE

## 2022-12-16 LAB — GC/CHLAMYDIA PROBE AMP
Chlamydia trachomatis, NAA: NEGATIVE
Neisseria Gonorrhoeae by PCR: NEGATIVE

## 2023-02-10 ENCOUNTER — Encounter: Payer: Self-pay | Admitting: Emergency Medicine

## 2023-02-10 ENCOUNTER — Ambulatory Visit: Payer: Medicaid Other | Admitting: Emergency Medicine

## 2023-02-10 VITALS — BP 140/92 | HR 87 | Temp 98.0°F | Ht 70.0 in | Wt 155.8 lb

## 2023-02-10 DIAGNOSIS — F4323 Adjustment disorder with mixed anxiety and depressed mood: Secondary | ICD-10-CM | POA: Diagnosis not present

## 2023-02-10 DIAGNOSIS — K130 Diseases of lips: Secondary | ICD-10-CM

## 2023-02-10 NOTE — Assessment & Plan Note (Signed)
Known triggers. Different situations some involving the law. Recommend psychological/psychiatric evaluation Referral placed today

## 2023-02-10 NOTE — Patient Instructions (Signed)
Health Maintenance, Male  Adopting a healthy lifestyle and getting preventive care are important in promoting health and wellness. Ask your health care provider about:  The right schedule for you to have regular tests and exams.  Things you can do on your own to prevent diseases and keep yourself healthy.  What should I know about diet, weight, and exercise?  Eat a healthy diet    Eat a diet that includes plenty of vegetables, fruits, low-fat dairy products, and lean protein.  Do not eat a lot of foods that are high in solid fats, added sugars, or sodium.  Maintain a healthy weight  Body mass index (BMI) is a measurement that can be used to identify possible weight problems. It estimates body fat based on height and weight. Your health care provider can help determine your BMI and help you achieve or maintain a healthy weight.  Get regular exercise  Get regular exercise. This is one of the most important things you can do for your health. Most adults should:  Exercise for at least 150 minutes each week. The exercise should increase your heart rate and make you sweat (moderate-intensity exercise).  Do strengthening exercises at least twice a week. This is in addition to the moderate-intensity exercise.  Spend less time sitting. Even light physical activity can be beneficial.  Watch cholesterol and blood lipids  Have your blood tested for lipids and cholesterol at 33 years of age, then have this test every 5 years.  You may need to have your cholesterol levels checked more often if:  Your lipid or cholesterol levels are high.  You are older than 33 years of age.  You are at high risk for heart disease.  What should I know about cancer screening?  Many types of cancers can be detected early and may often be prevented. Depending on your health history and family history, you may need to have cancer screening at various ages. This may include screening for:  Colorectal cancer.  Prostate cancer.  Skin cancer.  Lung  cancer.  What should I know about heart disease, diabetes, and high blood pressure?  Blood pressure and heart disease  High blood pressure causes heart disease and increases the risk of stroke. This is more likely to develop in people who have high blood pressure readings or are overweight.  Talk with your health care provider about your target blood pressure readings.  Have your blood pressure checked:  Every 3-5 years if you are 1-31 years of age.  Every year if you are 28 years old or older.  If you are between the ages of 29 and 4 and are a current or former smoker, ask your health care provider if you should have a one-time screening for abdominal aortic aneurysm (AAA).  Diabetes  Have regular diabetes screenings. This checks your fasting blood sugar level. Have the screening done:  Once every three years after age 40 if you are at a normal weight and have a low risk for diabetes.  More often and at a younger age if you are overweight or have a high risk for diabetes.  What should I know about preventing infection?  Hepatitis B  If you have a higher risk for hepatitis B, you should be screened for this virus. Talk with your health care provider to find out if you are at risk for hepatitis B infection.  Hepatitis C  Blood testing is recommended for:  Everyone born from [birth year] through 1965.  Anyone  with known risk factors for hepatitis C.  Sexually transmitted infections (STIs)  You should be screened each year for STIs, including gonorrhea and chlamydia, if:  You are sexually active and are younger than 33 years of age.  You are older than 33 years of age and your health care provider tells you that you are at risk for this type of infection.  Your sexual activity has changed since you were last screened, and you are at increased risk for chlamydia or gonorrhea. Ask your health care provider if you are at risk.  Ask your health care provider about whether you are at high risk for HIV. Your health care provider  may recommend a prescription medicine to help prevent HIV infection. If you choose to take medicine to prevent HIV, you should first get tested for HIV. You should then be tested every 3 months for as long as you are taking the medicine.  Follow these instructions at home:  Alcohol use  Do not drink alcohol if your health care provider tells you not to drink.  If you drink alcohol:  Limit how much you have to 0-2 drinks a day.  Know how much alcohol is in your drink. In the [place] one drink equals one 12 oz bottle of beer (355 mL), one 5 oz glass of wine (148 mL), or one 1 oz glass of hard liquor (44 mL).  Lifestyle  Do not use any products that contain nicotine or tobacco. These products include cigarettes, chewing tobacco, and vaping devices, such as e-cigarettes. If you need help quitting, ask your health care provider.  Do not use street drugs.  Do not share needles.  Ask your health care provider for help if you need support or information about quitting drugs.  General instructions  Schedule regular health, dental, and eye exams.  Stay current with your vaccines.  Tell your health care provider if:  You often feel depressed.  You have ever been abused or do not feel safe at home.  Summary  Adopting a healthy lifestyle and getting preventive care are important in promoting health and wellness.  Follow your health care provider's instructions about healthy diet, exercising, and getting tested or screened for diseases.  Follow your health care provider's instructions on monitoring your cholesterol and blood pressure.  This information is not intended to replace advice given to you by your health care provider. Make sure you discuss any questions you have with your health care provider.  Document Revised: 05/19/2020 Document Reviewed: 05/19/2020  Elsevier Patient Education  2024 [employer].

## 2023-02-10 NOTE — Progress Notes (Signed)
Mason Quinn 33 y.o.   Chief Complaint  Patient presents with   Annual Exam    HISTORY OF PRESENT ILLNESS: This is a 33 y.o. male complaining of lip lesion since 2022.  Requesting dermatology referral. Also feeling depressed.  Requesting referral to psychiatrist. No other complaints or medical concerns today Had his annual exam December 2024.  Unremarkable findings.  HPI   Prior to Admission medications   Medication Sig Start Date End Date Taking? Authorizing Provider  valACYclovir (VALTREX) 500 MG tablet Take 1 tablet (500 mg total) by mouth daily. 06/01/22   Elenore Paddy, NP    No Known Allergies  Patient Active Problem List   Diagnosis Date Noted   Nonspecific chest pain 12/14/2022   Penile discharge 05/28/2022   Chronic right shoulder pain 05/28/2022   Attention and concentration deficit 07/17/2021   Encounter for lipid screening for cardiovascular disease 07/17/2021   Hyperlipidemia 07/17/2021    History reviewed. No pertinent past medical history.  History reviewed. No pertinent surgical history.  Social History   Socioeconomic History   Marital status: Single    Spouse name: n/a   Number of children: 1   Years of education: college   Highest education level: Associate degree: occupational, Scientist, product/process development, or vocational program  Occupational History   Occupation: Engineer, manufacturing    Comment: makes paint  Tobacco Use   Smoking status: Some Days   Smokeless tobacco: Never  Substance and Sexual Activity   Alcohol use: No    Alcohol/week: 0.0 standard drinks of alcohol   Drug use: Yes    Types: Marijuana   Sexual activity: Yes    Partners: Female  Other Topics Concern   Not on file  Social History Narrative   Lives alone.   Family lives in [place], [place].   Social Drivers of Corporate investment banker Strain: Low Risk  (05/27/2022)   Overall Financial Resource Strain (CARDIA)    Difficulty of Paying Living Expenses: Not hard at all  Food  Insecurity: No Food Insecurity (05/27/2022)   Hunger Vital Sign    Worried About Running Out of Food in the Last Year: Never true    Ran Out of Food in the Last Year: Never true  Transportation Needs: No Transportation Needs (05/27/2022)   PRAPARE - Administrator, Civil Service (Medical): No    Lack of Transportation (Non-Medical): No  Physical Activity: Insufficiently Active (05/27/2022)   Exercise Vital Sign    Days of Exercise per Week: 2 days    Minutes of Exercise per Session: 60 min  Stress: No Stress Concern Present (05/27/2022)   [employer] of Occupational Health - Occupational Stress Questionnaire    Feeling of Stress : Not at all  Social Connections: Unknown (05/27/2022)   Social Connection and Isolation Panel [NHANES]    Frequency of Communication with Friends and Family: Never    Frequency of Social Gatherings with Friends and Family: Never    Attends Religious Services: Never    Database administrator or Organizations: No    Attends Engineer, structural: Not on file    Marital Status: Patient declined  Catering manager Violence: Not on file    Family History  Problem Relation Age of Onset   Cancer Mother        Melonoma in the Brain   Hypertension Mother    Cancer Paternal Grandmother    Cancer Paternal Aunt      Review of Systems  Constitutional: Negative.  Negative for chills and fever.  HENT:  Negative for congestion and sore throat.   Respiratory: Negative.  Negative for cough and shortness of breath.   Cardiovascular: Negative.  Negative for chest pain and palpitations.  Gastrointestinal:  Negative for abdominal pain, diarrhea, nausea and vomiting.  Genitourinary: Negative.  Negative for dysuria and hematuria.  Skin: Negative.        Lip lesion  Neurological:  Negative for dizziness and headaches.  All other systems reviewed and are negative.   Vitals:   02/10/23 0827  BP: (!) 140/92  Pulse: 87  Temp: 98 F (36.7 C)   SpO2: 100%    Physical Exam Vitals reviewed.  Constitutional:      Appearance: Normal appearance.  HENT:     Head: Normocephalic.     Mouth/Throat:     Mouth: Mucous membranes are moist.     Pharynx: Oropharynx is clear.     Comments: Lip lesion.  See picture below Eyes:     Extraocular Movements: Extraocular movements intact.  Cardiovascular:     Rate and Rhythm: Normal rate.  Pulmonary:     Effort: Pulmonary effort is normal.  Musculoskeletal:     Cervical back: No tenderness.  Lymphadenopathy:     Cervical: No cervical adenopathy.  Skin:    General: Skin is warm and dry.  Neurological:     Mental Status: He is alert and oriented to person, place, and time.  Psychiatric:        Behavior: Behavior normal.      ASSESSMENT & PLAN: A total of 33 minutes was spent with the patient and counseling/coordination of care regarding preparing for this visit, review of most recent office visit notes, review of most recent blood work results, presence of lip lesion and need for dermatology evaluation, diagnosis of situational depression and need for psychiatric evaluation, prognosis, documentation and need for follow-up  Problem List Items Addressed This Visit       Digestive   Lip lesion - Primary   Present for the past couple years. Has not been evaluated by dermatologist yet Recommend dermatology evaluation Referral placed today.      Relevant Orders   Ambulatory referral to Dermatology     Other   Situational mixed anxiety and depressive disorder   Known triggers. Different situations some involving the law. Recommend psychological/psychiatric evaluation Referral placed today      Relevant Orders   Ambulatory referral to Psychology   Patient Instructions  Health Maintenance, Male Adopting a healthy lifestyle and getting preventive care are important in promoting health and wellness. Ask your health care provider about: The right schedule for you to have  regular tests and exams. Things you can do on your own to prevent diseases and keep yourself healthy. What should I know about diet, weight, and exercise? Eat a healthy diet  Eat a diet that includes plenty of vegetables, fruits, low-fat dairy products, and lean protein. Do not eat a lot of foods that are high in solid fats, added sugars, or sodium. Maintain a healthy weight Body mass index (BMI) is a measurement that can be used to identify possible weight problems. It estimates body fat based on height and weight. Your health care provider can help determine your BMI and help you achieve or maintain a healthy weight. Get regular exercise Get regular exercise. This is one of the most important things you can do for your health. Most adults should: Exercise for at least 150  minutes each week. The exercise should increase your heart rate and make you sweat (moderate-intensity exercise). Do strengthening exercises at least twice a week. This is in addition to the moderate-intensity exercise. Spend less time sitting. Even light physical activity can be beneficial. Watch cholesterol and blood lipids Have your blood tested for lipids and cholesterol at 33 years of age, then have this test every 5 years. You may need to have your cholesterol levels checked more often if: Your lipid or cholesterol levels are high. You are older than 33 years of age. You are at high risk for heart disease. What should I know about cancer screening? Many types of cancers can be detected early and may often be prevented. Depending on your health history and family history, you may need to have cancer screening at various ages. This may include screening for: Colorectal cancer. Prostate cancer. Skin cancer. Lung cancer. What should I know about heart disease, diabetes, and high blood pressure? Blood pressure and heart disease High blood pressure causes heart disease and increases the risk of stroke. This is more  likely to develop in people who have high blood pressure readings or are overweight. Talk with your health care provider about your target blood pressure readings. Have your blood pressure checked: Every 3-5 years if you are 41-65 years of age. Every year if you are 44 years old or older. If you are between the ages of 54 and 28 and are a current or former smoker, ask your health care provider if you should have a one-time screening for abdominal aortic aneurysm (AAA). Diabetes Have regular diabetes screenings. This checks your fasting blood sugar level. Have the screening done: Once every three years after age 47 if you are at a normal weight and have a low risk for diabetes. More often and at a younger age if you are overweight or have a high risk for diabetes. What should I know about preventing infection? Hepatitis B If you have a higher risk for hepatitis B, you should be screened for this virus. Talk with your health care provider to find out if you are at risk for hepatitis B infection. Hepatitis C Blood testing is recommended for: Everyone born from [birth year] through 1965. Anyone with known risk factors for hepatitis C. Sexually transmitted infections (STIs) You should be screened each year for STIs, including gonorrhea and chlamydia, if: You are sexually active and are younger than 33 years of age. You are older than 33 years of age and your health care provider tells you that you are at risk for this type of infection. Your sexual activity has changed since you were last screened, and you are at increased risk for chlamydia or gonorrhea. Ask your health care provider if you are at risk. Ask your health care provider about whether you are at high risk for HIV. Your health care provider may recommend a prescription medicine to help prevent HIV infection. If you choose to take medicine to prevent HIV, you should first get tested for HIV. You should then be tested every 3 months for as long as  you are taking the medicine. Follow these instructions at home: Alcohol use Do not drink alcohol if your health care provider tells you not to drink. If you drink alcohol: Limit how much you have to 0-2 drinks a day. Know how much alcohol is in your drink. In the [place] one drink equals one 12 oz bottle of beer (355 mL), one 5 oz glass of wine (  148 mL), or one 1 oz glass of hard liquor (44 mL). Lifestyle Do not use any products that contain nicotine or tobacco. These products include cigarettes, chewing tobacco, and vaping devices, such as e-cigarettes. If you need help quitting, ask your health care provider. Do not use street drugs. Do not share needles. Ask your health care provider for help if you need support or information about quitting drugs. General instructions Schedule regular health, dental, and eye exams. Stay current with your vaccines. Tell your health care provider if: You often feel depressed. You have ever been abused or do not feel safe at home. Summary Adopting a healthy lifestyle and getting preventive care are important in promoting health and wellness. Follow your health care provider's instructions about healthy diet, exercising, and getting tested or screened for diseases. Follow your health care provider's instructions on monitoring your cholesterol and blood pressure. This information is not intended to replace advice given to you by your health care provider. Make sure you discuss any questions you have with your health care provider. Document Revised: 05/19/2020 Document Reviewed: 05/19/2020 Elsevier Patient Education  2024 Elsevier Inc.   Edwina Barth, MD [facility]

## 2023-02-10 NOTE — Assessment & Plan Note (Signed)
Present for the past couple years. Has not been evaluated by dermatologist yet Recommend dermatology evaluation Referral placed today.

## 2023-02-15 ENCOUNTER — Encounter: Payer: Self-pay | Admitting: Emergency Medicine

## 2023-09-15 ENCOUNTER — Ambulatory Visit: Payer: Medicaid Other | Admitting: Dermatology

## 2024-01-11 ENCOUNTER — Ambulatory Visit: Admitting: Physician Assistant
